# Patient Record
Sex: Male | Born: 1965 | Race: Black or African American | Hispanic: No | Marital: Married | Smoking: Former smoker
Health system: Southern US, Community
[De-identification: ages and names within clinical notes are randomized; demographics above are authoritative.]

## PROBLEM LIST (undated history)

## (undated) ENCOUNTER — Ambulatory Visit: Admission: EM

## (undated) DIAGNOSIS — M1990 Unspecified osteoarthritis, unspecified site: Secondary | ICD-10-CM

## (undated) HISTORY — PX: OTHER SURGICAL HISTORY: SHX169

## (undated) HISTORY — DX: Unspecified osteoarthritis, unspecified site: M19.90

---

## 2001-03-11 ENCOUNTER — Encounter: Payer: Self-pay | Admitting: Emergency Medicine

## 2001-03-11 ENCOUNTER — Emergency Department (HOSPITAL_COMMUNITY): Admission: EM | Admit: 2001-03-11 | Discharge: 2001-03-11 | Payer: Self-pay | Admitting: Emergency Medicine

## 2013-03-24 ENCOUNTER — Encounter (HOSPITAL_COMMUNITY): Payer: Self-pay | Admitting: *Deleted

## 2013-03-24 ENCOUNTER — Emergency Department (HOSPITAL_COMMUNITY)
Admission: EM | Admit: 2013-03-24 | Discharge: 2013-03-24 | Disposition: A | Discharge status: Home / Self Care | Payer: Self-pay | Attending: Emergency Medicine | Admitting: Emergency Medicine

## 2013-03-24 ENCOUNTER — Emergency Department (HOSPITAL_COMMUNITY): Payer: Self-pay

## 2013-03-24 DIAGNOSIS — R609 Edema, unspecified: Secondary | ICD-10-CM | POA: Insufficient documentation

## 2013-03-24 DIAGNOSIS — M7040 Prepatellar bursitis, unspecified knee: Secondary | ICD-10-CM | POA: Insufficient documentation

## 2013-03-24 DIAGNOSIS — F17200 Nicotine dependence, unspecified, uncomplicated: Secondary | ICD-10-CM | POA: Insufficient documentation

## 2013-03-24 DIAGNOSIS — M7041 Prepatellar bursitis, right knee: Secondary | ICD-10-CM

## 2013-03-24 MED ORDER — OXYCODONE-ACETAMINOPHEN 5-325 MG PO TABS
1.0000 | ORAL_TABLET | Freq: Once | ORAL | Status: AC
  Administered 2013-03-24: 1 via ORAL
  Filled 2013-03-24: qty 1

## 2013-03-24 MED ORDER — OXYCODONE-ACETAMINOPHEN 5-325 MG PO TABS: 1.0000 | ORAL_TABLET | Freq: Four times a day (QID) | ORAL | Status: DC | PRN

## 2013-03-24 MED ORDER — IBUPROFEN 600 MG PO TABS: 600.0000 mg | ORAL_TABLET | Freq: Four times a day (QID) | ORAL | Status: DC | PRN

## 2013-03-24 NOTE — ED Notes (Signed)
Pt given d/c instructions, 2 scripts given, verbalized understanding of all, walked out unaided.

## 2013-03-24 NOTE — ED Provider Notes (Signed)
CSN: [id number]     Arrival date & time 03/24/13  1201 History    This chart was scribed for [employer]. [name], MD,  by Ashley Jacobs, ED Scribe. The patient was seen in room APA06/APA06 and the patient's care was started at 2:04 PM.   First MD Initiated Contact with Patient 03/24/13 1357     Chief Complaint  Patient presents with  . Knee Pain   (Consider location/radiation/quality/duration/timing/severity/associated sxs/prior Treatment) Patient is a 47 y.o. male presenting with knee pain. The history is provided by the patient and medical records. No language interpreter was used.  Knee Pain  HPI Comments: Joel Evans is a 47 y.o. male who presents to the Emergency Department complaining of right knee pain that presented yesterday with unknown onset. Pt mentions that yesterday he suddenly had swelling, "heat" and non-radiating pain to his anterior right knee. He reports that the pain is worsened with exertion and movement and relieve by nothing. Pt did not take anything for pain.  When asked to describe the pain he mentions that he experiences pain that is sharp, throbbing and constant.  Pt denies drug usage but does report smoking Pt does not have a hx of gout or any other complications.  History reviewed. No pertinent past medical history. History reviewed. No pertinent past surgical history. History reviewed. No pertinent family history. History  Substance Use Topics  . Smoking status: Current Every Day Smoker  . Smokeless tobacco: Not on file  . Alcohol Use: No    Review of Systems  Musculoskeletal: Positive for joint swelling (right knee).  All other systems reviewed and are negative.    Allergies  Review of patient's allergies indicates no known allergies.  Home Medications   Current Outpatient Rx  Name  Route  Sig  Dispense  Refill  . ibuprofen (ADVIL,MOTRIN) 600 MG tablet   Oral   Take 1 tablet (600 mg total) by mouth every 6 (six) hours as needed for  pain.   20 tablet   0   . oxyCODONE-acetaminophen (PERCOCET/ROXICET) 5-325 MG per tablet   Oral   Take 1-2 tablets by mouth every 6 (six) hours as needed for pain.   10 tablet   0    BP 117/65  Pulse 88  Temp(Src) 98.3 F (36.8 C) (Oral)  Resp 20  Ht 5\' 9"  (1.753 m)  Wt 180 lb (81.647 kg)  BMI 26.57 kg/m2  SpO2 97% Physical Exam  Nursing note and vitals reviewed. Constitutional: He is oriented to person, place, and time. He appears well-developed and well-nourished. No distress.  HENT:  Head: Normocephalic and atraumatic.  Eyes: EOM are normal. Pupils are equal, round, and reactive to light.  Neck: Normal range of motion. Neck supple. No tracheal deviation present.  Cardiovascular: Normal rate.   Pulmonary/Chest: Effort normal and breath sounds normal. No respiratory distress.  Abdominal: Soft. He exhibits no distension.  Musculoskeletal: Normal range of motion. He exhibits edema (R knee) and tenderness (R knee).  Neurological: He is alert and oriented to person, place, and time.  Skin: Skin is warm and dry.  Mild skin thickening. Right knee effusion   Psychiatric: He has a normal mood and affect. His behavior is normal.    ED Course  DIAGNOSTIC STUDIES: Oxygen Saturation is 97% on room air, normal by my interpretation.   Medications  oxyCODONE-acetaminophen (PERCOCET/ROXICET) 5-325 MG per tablet 1 tablet (1 tablet Oral Given 03/24/13 1417)    COORDINATION OF CARE: 2:07 PM Discussed course  of care with pt . Pt understands and agrees.   Procedures (including critical care time)  Labs Reviewed - No data to display Dg Knee Complete 4 Views Right  03/24/2013   *RADIOLOGY REPORT*  Clinical Data: Knee pain and swelling  RIGHT KNEE - COMPLETE 4+ VIEW  Comparison: None.  Findings: Frontal, lateral, and bilateral oblique views were obtained.  There is no fracture, dislocation, or effusion.  There is a small spur arising from the anterior superior patella.  There is slight  patellofemoral joint space narrowing.  No erosive change.  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  No fracture or effusion.   Original Report Authenticated By: Bretta Bang, M.D.   1. Prepatellar bursitis, right     MDM  Patient with right knee pain. He is a Visual merchandiser. It is likely a prepatellar bursitis. I doubt septic bursitis. We'll give pain medicines and anti-inflammatories and follow with orthopedic I personally performed the services described in this documentation, which was scribed in my presence. The recorded information has been reviewed and is accurate.     Juliet Rude. [name], MD 03/24/13 2210

## 2013-03-24 NOTE — ED Notes (Signed)
Rt knee swollen, hot, onset yesterday

## 2013-03-24 NOTE — ED Notes (Signed)
Pt presents with rt knee pain and swelling x 24 hrs, worsening throughout the day. Pt denies trauma and injury to said knee. Denies history of gout. Pt reports is difficult to bear weight secondary to pain.  NAD noted. Pulses strong distal to swelling.

## 2013-12-19 ENCOUNTER — Encounter (HOSPITAL_COMMUNITY): Payer: Self-pay | Admitting: Emergency Medicine

## 2013-12-19 ENCOUNTER — Emergency Department (HOSPITAL_COMMUNITY)
Admission: EM | Admit: 2013-12-19 | Discharge: 2013-12-20 | Disposition: A | Discharge status: Home / Self Care | Payer: Self-pay | Attending: Emergency Medicine | Admitting: Emergency Medicine

## 2013-12-19 ENCOUNTER — Emergency Department (HOSPITAL_COMMUNITY): Payer: Self-pay

## 2013-12-19 DIAGNOSIS — Z791 Long term (current) use of non-steroidal anti-inflammatories (NSAID): Secondary | ICD-10-CM | POA: Insufficient documentation

## 2013-12-19 DIAGNOSIS — Z79899 Other long term (current) drug therapy: Secondary | ICD-10-CM | POA: Insufficient documentation

## 2013-12-19 DIAGNOSIS — L02619 Cutaneous abscess of unspecified foot: Secondary | ICD-10-CM | POA: Insufficient documentation

## 2013-12-19 DIAGNOSIS — D72829 Elevated white blood cell count, unspecified: Secondary | ICD-10-CM | POA: Insufficient documentation

## 2013-12-19 DIAGNOSIS — Z87891 Personal history of nicotine dependence: Secondary | ICD-10-CM | POA: Insufficient documentation

## 2013-12-19 DIAGNOSIS — Z792 Long term (current) use of antibiotics: Secondary | ICD-10-CM | POA: Insufficient documentation

## 2013-12-19 DIAGNOSIS — L03119 Cellulitis of unspecified part of limb: Secondary | ICD-10-CM | POA: Insufficient documentation

## 2013-12-19 MED ORDER — KETOROLAC TROMETHAMINE 30 MG/ML IJ SOLN
30.0000 mg | Freq: Once | INTRAMUSCULAR | Status: AC
  Administered 2013-12-20: 30 mg via INTRAVENOUS
  Filled 2013-12-19: qty 1

## 2013-12-19 NOTE — ED Provider Notes (Signed)
48 year old male has noted redness and swelling of his right ankle the last week. He states at one point the swelling had gone relative to his knee but actually has subsided. He is complaining of pain in that area. He denies fever or chills. He noticed a blister coming up today. On exam, there is erythema and mild/moderate soft tissue swelling throughout the right ankle with a bulla present anteriorly. There no lymphangitic streaks are seen. Laboratory work is pending. He will clearly need antibiotics. At this point, unclear whether he can be adequately treated with oral antibiotics as an outpatient or whether he will need inpatient care.  Medical screening examination/treatment/procedure(s) were conducted as a shared visit with non-physician practitioner(s) and myself.  I personally evaluated the patient during the encounter.   Delora Fuel, MD 84/16/60 6301

## 2013-12-19 NOTE — ED Notes (Signed)
Pt c/o right foot swelling. Pts right foot and ankle are red, warm, and swollen. Pt has a blister where his foot and leg meet.

## 2013-12-20 LAB — CBC WITH DIFFERENTIAL/PLATELET
BASOS ABS: 0 10*3/uL (ref 0.0–0.1)
BASOS PCT: 0 % (ref 0–1)
EOS ABS: 0.2 10*3/uL (ref 0.0–0.7)
Eosinophils Relative: 2 % (ref 0–5)
HCT: 36.4 % — ABNORMAL LOW (ref 39.0–52.0)
HEMOGLOBIN: 12.5 g/dL — AB (ref 13.0–17.0)
Lymphocytes Relative: 20 % (ref 12–46)
Lymphs Abs: 2.3 10*3/uL (ref 0.7–4.0)
MCH: 31.2 pg (ref 26.0–34.0)
MCHC: 34.3 g/dL (ref 30.0–36.0)
MCV: 90.8 fL (ref 78.0–100.0)
MONOS PCT: 10 % (ref 3–12)
Monocytes Absolute: 1.1 10*3/uL — ABNORMAL HIGH (ref 0.1–1.0)
NEUTROS ABS: 7.9 10*3/uL — AB (ref 1.7–7.7)
Neutrophils Relative %: 68 % (ref 43–77)
PLATELETS: 302 10*3/uL (ref 150–400)
RBC: 4.01 MIL/uL — ABNORMAL LOW (ref 4.22–5.81)
RDW: 12.6 % (ref 11.5–15.5)
WBC: 11.6 10*3/uL — ABNORMAL HIGH (ref 4.0–10.5)

## 2013-12-20 LAB — BASIC METABOLIC PANEL
BUN: 11 mg/dL (ref 6–23)
CO2: 27 mEq/L (ref 19–32)
Calcium: 8.8 mg/dL (ref 8.4–10.5)
Chloride: 103 mEq/L (ref 96–112)
Creatinine, Ser: 0.77 mg/dL (ref 0.50–1.35)
GFR calc Af Amer: >90 mL/min (ref >90)
Glucose, Bld: 80 mg/dL (ref 70–99)
POTASSIUM: 4 meq/L (ref 3.7–5.3)
Sodium: 140 mEq/L (ref 137–147)

## 2013-12-20 MED ORDER — CEPHALEXIN 500 MG PO CAPS: 500.0000 mg | ORAL_CAPSULE | Freq: Four times a day (QID) | ORAL | Status: DC

## 2013-12-20 MED ORDER — VANCOMYCIN HCL IN DEXTROSE 1-5 GM/200ML-% IV SOLN
1000.0000 mg | Freq: Once | INTRAVENOUS | Status: AC
  Administered 2013-12-20: 1000 mg via INTRAVENOUS
  Filled 2013-12-20: qty 200

## 2013-12-20 MED ORDER — MORPHINE SULFATE 4 MG/ML IJ SOLN
4.0000 mg | Freq: Once | INTRAMUSCULAR | Status: AC
  Administered 2013-12-20: 4 mg via INTRAVENOUS
  Filled 2013-12-20: qty 1

## 2013-12-20 MED ORDER — OXYCODONE-ACETAMINOPHEN 5-325 MG PO TABS: 1.0000 | ORAL_TABLET | ORAL | Status: DC | PRN

## 2013-12-20 MED ORDER — ONDANSETRON HCL 4 MG/2ML IJ SOLN
4.0000 mg | Freq: Once | INTRAMUSCULAR | Status: AC
  Administered 2013-12-20: 4 mg via INTRAVENOUS
  Filled 2013-12-20: qty 2

## 2013-12-20 NOTE — ED Provider Notes (Signed)
CSN: [id number]     Arrival date & time 12/19/13  2207 History   First MD Initiated Contact with Patient 12/19/13 2335     Chief Complaint  Patient presents with  . Foot Swelling     (Consider location/radiation/quality/duration/timing/severity/associated sxs/prior Treatment) The history is provided by the patient and the spouse.    Joel Evans is a 48 y.o. male presenting with a one week history of right ankle pain, swelling and redness.  He states that he had symptoms originally in his right knee as well which resolved after taking ibuprofen, but his foot pain has persisted and now worsened.  He denies fevers or chills and denies radiation of pain which is worsened with palpation and weight bearing.  He denies injury, including any knowledge of insect bites, etc.  He has no other medical conditions and does not have a primary doctor.     History reviewed. No pertinent past medical history. History reviewed. No pertinent past surgical history. No family history on file. History  Substance Use Topics  . Smoking status: Former Research scientist (life sciences)  . Smokeless tobacco: Not on file  . Alcohol Use: No    Review of Systems  Constitutional: Negative for fever and chills.  Respiratory: Negative for shortness of breath and wheezing.   Skin: Positive for color change. Negative for wound.  Neurological: Negative for numbness.      Allergies  Review of patient's allergies indicates no known allergies.  Home Medications   Prior to Admission medications   Medication Sig Start Date End Date Taking? Authorizing Provider  cephALEXin (KEFLEX) 500 MG capsule Take 1 capsule (500 mg total) by mouth 4 (four) times daily. 12/20/13   Evalee Jefferson, PA-C  ibuprofen (ADVIL,MOTRIN) 600 MG tablet Take 1 tablet (600 mg total) by mouth every 6 (six) hours as needed for pain. 03/24/13   Jasper Riling. Pickering, MD  oxyCODONE-acetaminophen (PERCOCET/ROXICET) 5-325 MG per tablet Take 1-2 tablets by mouth every 6 (six)  hours as needed for pain. 03/24/13   Jasper Riling. Pickering, MD  oxyCODONE-acetaminophen (PERCOCET/ROXICET) 5-325 MG per tablet Take 1 tablet by mouth every 4 (four) hours as needed for severe pain. 12/20/13   Evalee Jefferson, PA-C   BP 134/88  Pulse 82  Temp(Src) 97.9 F (36.6 C) (Oral)  Resp 18  Ht 5\' 9"  (1.753 m)  Wt 175 lb (79.379 kg)  BMI 25.83 kg/m2  SpO2 97% Physical Exam  Nursing note and vitals reviewed. Constitutional: He appears well-developed and well-nourished.  HENT:  Head: Normocephalic and atraumatic.  Eyes: Conjunctivae are normal.  Neck: Normal range of motion.  Cardiovascular: Normal rate, regular rhythm, normal heart sounds and intact distal pulses.   Pulses:      Dorsalis pedis pulses are 2+ on the right side, and 2+ on the left side.  Pulmonary/Chest: Effort normal and breath sounds normal. No respiratory distress. He has no wheezes.  Abdominal: Soft. Bowel sounds are normal. There is no tenderness.  Musculoskeletal: Normal range of motion.  Neurological: He is alert.  Skin: Skin is warm and dry.  Moderate edema with erythema noted right anterior ankle including medially.  There is an intact bulla anterior ankle.  No red streaking and no edema in lower leg beyond border or erythema.  Right knee is not edematous, no erythema.    Psychiatric: He has a normal mood and affect.    ED Course  Procedures (including critical care time) Labs Review Labs Reviewed  CBC WITH DIFFERENTIAL - Abnormal; Notable  for the following:    WBC 11.6 (*)    RBC 4.01 (*)    Hemoglobin 12.5 (*)    HCT 36.4 (*)    Neutro Abs 7.9 (*)    Monocytes Absolute 1.1 (*)    All other components within normal limits  BASIC METABOLIC PANEL    Imaging Review Dg Ankle Complete Right  12/20/2013   CLINICAL DATA:  Right ankle pain and swelling.  No known injury.  EXAM: RIGHT ANKLE - COMPLETE 3+ VIEW  COMPARISON:  None.  FINDINGS: Diffuse soft tissue swelling. This is more focal anteriorly without or  bulging of the skin. Superior tarsal bone hyperostosis. No bone destruction or periosteal reaction. No soft tissue gas.  IMPRESSION: Diffuse soft tissue swelling without evidence of underlying osteomyelitis.   Electronically Signed   By: Enrique Sack M.D.   On: 12/20/2013 00:27     EKG Interpretation None      MDM   Final diagnoses:  Cellulitis of foot    Pt was seen by Dr Roxanne Mins during this visit.  Patients labs and/or radiological studies were viewed and considered during the medical decision making and disposition process. Mild leukocytosis, no sign of effusion/ no osteomyelitis per plain xrays.  No fluctuance of wound to suggest abscess.  He is able to flex his ankle with minimal discomfort, doubt septic joint.  Pt was given vancomycin 1 gram IV and placed on keflex,  Oxycodone for pain relief.  Encouraged warm compresses and elevation, recheck here in 2 days if stable, tomorrow for any increased pain, swelling or spreading redness.  cellulitis edges marked with pen.    Evalee Jefferson, PA-C 12/20/13 0201

## 2013-12-20 NOTE — ED Notes (Signed)
Discharge instructions and prescriptions given and reviewed with patient.  Patient verbalized understanding to complete all antibiotic, sedating effects of Percocet and to return to ER in 2 days for wound check.  Patient ambulatory; discharged home in good condition.  Significant other accompanied discharge to drive home.

## 2013-12-20 NOTE — Discharge Instructions (Signed)
Cellulitis Cellulitis is an infection of the skin and the tissue under the skin. The infected area is usually red and tender. This happens most often in the arms and lower legs. HOME CARE   Take your antibiotic medicine as told. Finish the medicine even if you start to feel better.  Keep the infected arm or leg raised (elevated).  Put a warm cloth on the area up to 4 times per day.  Only take medicines as told by your doctor.  Keep all doctor visits as told. GET HELP RIGHT AWAY IF:   You have a fever.  You feel very sleepy.  You throw up (vomit) or have watery poop (diarrhea).  You feel sick and have muscle aches and pains.  You see red streaks on the skin coming from the infected area.  Your red area gets bigger or turns a dark color.  Your bone or joint under the infected area is painful after the skin heals.  Your infection comes back in the same area or different area.  You have a puffy (swollen) bump in the infected area.  You have new symptoms. MAKE SURE YOU:   Understand these instructions.  Will watch your condition.  Will get help right away if you are not doing well or get worse. Document Released: 01/09/2008 Document Revised: 01/22/2012 Document Reviewed: 10/08/2011 [facility] Patient Information 2014 [place], [place].  Start taking your antibiotic prescribed in the morning.  You may take the oxycodone prescribed for pain relief.  This will make you drowsy - do not drive within 4 hours of taking this medication.

## 2013-12-21 ENCOUNTER — Emergency Department (HOSPITAL_COMMUNITY)
Admission: EM | Admit: 2013-12-21 | Discharge: 2013-12-21 | Disposition: A | Discharge status: Home / Self Care | Payer: Self-pay | Attending: Emergency Medicine | Admitting: Emergency Medicine

## 2013-12-21 ENCOUNTER — Encounter (HOSPITAL_COMMUNITY): Payer: Self-pay | Admitting: Emergency Medicine

## 2013-12-21 DIAGNOSIS — Z79899 Other long term (current) drug therapy: Secondary | ICD-10-CM | POA: Insufficient documentation

## 2013-12-21 DIAGNOSIS — L03115 Cellulitis of right lower limb: Secondary | ICD-10-CM

## 2013-12-21 DIAGNOSIS — L02619 Cutaneous abscess of unspecified foot: Secondary | ICD-10-CM | POA: Insufficient documentation

## 2013-12-21 DIAGNOSIS — Z87891 Personal history of nicotine dependence: Secondary | ICD-10-CM | POA: Insufficient documentation

## 2013-12-21 DIAGNOSIS — L03119 Cellulitis of unspecified part of limb: Principal | ICD-10-CM

## 2013-12-21 DIAGNOSIS — Z792 Long term (current) use of antibiotics: Secondary | ICD-10-CM | POA: Insufficient documentation

## 2013-12-21 LAB — CBC WITH DIFFERENTIAL/PLATELET
BASOS ABS: 0 10*3/uL (ref 0.0–0.1)
BASOS PCT: 0 % (ref 0–1)
Eosinophils Absolute: 0.2 10*3/uL (ref 0.0–0.7)
Eosinophils Relative: 2 % (ref 0–5)
HCT: 37.1 % — ABNORMAL LOW (ref 39.0–52.0)
Hemoglobin: 12.7 g/dL — ABNORMAL LOW (ref 13.0–17.0)
LYMPHS PCT: 27 % (ref 12–46)
Lymphs Abs: 2.6 10*3/uL (ref 0.7–4.0)
MCH: 30.9 pg (ref 26.0–34.0)
MCHC: 34.2 g/dL (ref 30.0–36.0)
MCV: 90.3 fL (ref 78.0–100.0)
Monocytes Absolute: 0.7 10*3/uL (ref 0.1–1.0)
Monocytes Relative: 7 % (ref 3–12)
NEUTROS PCT: 64 % (ref 43–77)
Neutro Abs: 6.3 10*3/uL (ref 1.7–7.7)
Platelets: 328 10*3/uL (ref 150–400)
RBC: 4.11 MIL/uL — ABNORMAL LOW (ref 4.22–5.81)
RDW: 12.5 % (ref 11.5–15.5)
WBC: 9.8 10*3/uL (ref 4.0–10.5)

## 2013-12-21 LAB — BASIC METABOLIC PANEL
BUN: 13 mg/dL (ref 6–23)
CHLORIDE: 102 meq/L (ref 96–112)
CO2: 27 mEq/L (ref 19–32)
Calcium: 8.8 mg/dL (ref 8.4–10.5)
Creatinine, Ser: 0.83 mg/dL (ref 0.50–1.35)
GFR calc non Af Amer: >90 mL/min (ref >90)
Glucose, Bld: 115 mg/dL — ABNORMAL HIGH (ref 70–99)
Potassium: 4.2 mEq/L (ref 3.7–5.3)
Sodium: 140 mEq/L (ref 137–147)

## 2013-12-21 MED ORDER — ONDANSETRON HCL 4 MG/2ML IJ SOLN
4.0000 mg | Freq: Once | INTRAMUSCULAR | Status: AC
  Administered 2013-12-21: 4 mg via INTRAVENOUS
  Filled 2013-12-21: qty 2

## 2013-12-21 MED ORDER — CLINDAMYCIN HCL 300 MG PO CAPS: ORAL_CAPSULE | ORAL | Status: DC

## 2013-12-21 MED ORDER — HYDROMORPHONE HCL 2 MG PO TABS: 2.0000 mg | ORAL_TABLET | ORAL | Status: DC | PRN

## 2013-12-21 MED ORDER — HYDROMORPHONE HCL PF 1 MG/ML IJ SOLN
1.0000 mg | Freq: Once | INTRAMUSCULAR | Status: AC
  Administered 2013-12-21: 1 mg via INTRAVENOUS
  Filled 2013-12-21: qty 1

## 2013-12-21 MED ORDER — SODIUM CHLORIDE 0.9 % IV SOLN
Freq: Once | INTRAVENOUS | Status: AC
  Administered 2013-12-21: 19:00:00 via INTRAVENOUS

## 2013-12-21 MED ORDER — DEXTROSE 5 % IV SOLN
1.0000 g | INTRAVENOUS | Status: DC
  Administered 2013-12-21: 1 g via INTRAVENOUS
  Filled 2013-12-21: qty 10

## 2013-12-21 MED ORDER — CLINDAMYCIN PHOSPHATE 600 MG/50ML IV SOLN
600.0000 mg | Freq: Once | INTRAVENOUS | Status: AC
  Administered 2013-12-21: 600 mg via INTRAVENOUS
  Filled 2013-12-21: qty 50

## 2013-12-21 NOTE — ED Provider Notes (Signed)
CSN: [id number]     Arrival date & time 12/21/13  1817 History  This chart was scribed for Marcene Brawn PA-C,  working with Leota Jacobsen, MD by Stacy Gardner, ED scribe. This patient was seen in room APFT23/APFT23 and the patient's care was started at [facility] PM.   First MD Initiated Contact with Patient 12/21/13 1830     Chief Complaint  Patient presents with  . Cellulitis     (Consider location/radiation/quality/duration/timing/severity/associated sxs/prior Treatment) The history is provided by the patient and medical records. No language interpreter was used.   HPI Comments: JAYAN RAYMUNDO is a 48 y.o. male who presents to the Emergency Department complaining of cellulitis recheck. Pt was seen 5/16 for cellulitis. He was given percocet and antibiotics. He reports the site is getting worse and he has a large blister . The site started when had friction from his shoe. He is self employed.     History reviewed. No pertinent past medical history. History reviewed. No pertinent past surgical history. History reviewed. No pertinent family history. History  Substance Use Topics  . Smoking status: Former Research scientist (life sciences)  . Smokeless tobacco: Not on file  . Alcohol Use: No    Review of Systems  Skin: Positive for wound.       Cellulitis to the right foot.       Allergies  Review of patient's allergies indicates no known allergies.  Home Medications   Prior to Admission medications   Medication Sig Start Date End Date Taking? Authorizing Provider  cephALEXin (KEFLEX) 500 MG capsule Take 1 capsule (500 mg total) by mouth 4 (four) times daily. 12/20/13   Evalee Jefferson, PA-C  ibuprofen (ADVIL,MOTRIN) 600 MG tablet Take 1 tablet (600 mg total) by mouth every 6 (six) hours as needed for pain. 03/24/13   Jasper Riling. Pickering, MD  oxyCODONE-acetaminophen (PERCOCET/ROXICET) 5-325 MG per tablet Take 1-2 tablets by mouth every 6 (six) hours as needed for pain. 03/24/13   Jasper Riling. Pickering, MD   oxyCODONE-acetaminophen (PERCOCET/ROXICET) 5-325 MG per tablet Take 1 tablet by mouth every 4 (four) hours as needed for severe pain. 12/20/13   Evalee Jefferson, PA-C   BP 120/69  Pulse 76  Temp(Src) 98.3 F (36.8 C) (Oral)  Resp 20  Ht 5\' 9"  (1.753 m)  Wt 175 lb (79.379 kg)  BMI 25.83 kg/m2  SpO2 100% Physical Exam  Nursing note and vitals reviewed. Constitutional: He is oriented to person, place, and time. He appears well-developed and well-nourished.  HENT:  Head: Normocephalic.  Eyes: EOM are normal.  Neck: Normal range of motion.  Pulmonary/Chest: Effort normal.  Abdominal: He exhibits no distension.  Musculoskeletal: Normal range of motion.  Neurological: He is alert and oriented to person, place, and time.  Skin: There is erythema.  Erythematous area. Does not extend beyond previously marked area. 2 cm x4 cm blister with serous fluid. Doesn't not appears to be pus.  Psychiatric: He has a normal mood and affect.    ED Course  Procedures (including critical care time) DIAGNOSTIC STUDIES: Oxygen Saturation is 100% on room air, normal by my interpretation.    COORDINATION OF CARE:  6:47 PM Discussed course of care with pt which includes IV antibiotics. Pt understands and agrees.   Labs Review Labs Reviewed - No data to display  Imaging Review Dg Ankle Complete Right  12/20/2013   CLINICAL DATA:  Right ankle pain and swelling.  No known injury.  EXAM: RIGHT ANKLE - COMPLETE 3+ VIEW  COMPARISON:  None.  FINDINGS: Diffuse soft tissue swelling. This is more focal anteriorly without or bulging of the skin. Superior tarsal bone hyperostosis. No bone destruction or periosteal reaction. No soft tissue gas.  IMPRESSION: Diffuse soft tissue swelling without evidence of underlying osteomyelitis.   Electronically Signed   By: Enrique Sack M.D.   On: 12/20/2013 00:27     EKG Interpretation None    Pt given IV clindamycin,  Dilaudid,   Labs reviewed Decreased wbc's.   I will treat with  clindamycin.   Pt given rx for 10 tablets dilaudid for pain.   Pt advised to recheck here tomorrow afternoon.    MDM   Final diagnoses:  Cellulitis of foot, right    rx for clindamycin Dilaudid  Fransico Meadow, PA-C 12/21/13 2105

## 2013-12-21 NOTE — Discharge Instructions (Signed)
Cellulitis Cellulitis is an infection of the skin and the tissue beneath it. The infected area is usually red and tender. Cellulitis occurs most often in the arms and lower legs.  CAUSES  Cellulitis is caused by bacteria that enter the skin through cracks or cuts in the skin. The most common types of bacteria that cause cellulitis are Staphylococcus and Streptococcus. SYMPTOMS   Redness and warmth.  Swelling.  Tenderness or pain.  Fever. DIAGNOSIS  Your caregiver can usually determine what is wrong based on a physical exam. Blood tests may also be done. TREATMENT  Treatment usually involves taking an antibiotic medicine. HOME CARE INSTRUCTIONS   Take your antibiotics as directed. Finish them even if you start to feel better.  Keep the infected arm or leg elevated to reduce swelling.  Apply a warm cloth to the affected area up to 4 times per day to relieve pain.  Only take over-the-counter or prescription medicines for pain, discomfort, or fever as directed by your caregiver.  Keep all follow-up appointments as directed by your caregiver. SEEK MEDICAL CARE IF:   You notice red streaks coming from the infected area.  Your red area gets larger or turns dark in color.  Your bone or joint underneath the infected area becomes painful after the skin has healed.  Your infection returns in the same area or another area.  You notice a swollen bump in the infected area.  You develop new symptoms. SEEK IMMEDIATE MEDICAL CARE IF:   You have a fever.  You feel very sleepy.  You develop vomiting or diarrhea.  You have a general ill feeling (malaise) with muscle aches and pains. MAKE SURE YOU:   Understand these instructions.  Will watch your condition.  Will get help right away if you are not doing well or get worse. Document Released: 05/02/2005 Document Revised: 01/22/2012 Document Reviewed: 10/08/2011 [employer] Patient Information 2014 [employer].  

## 2013-12-21 NOTE — ED Notes (Signed)
Cellulitis  Of rt foot, seen here 5/16, Here for recheck, cont to have pain

## 2013-12-21 NOTE — ED Provider Notes (Signed)
Medical screening examination/treatment/procedure(s) were performed by non-physician practitioner and as supervising physician I was immediately available for consultation/collaboration.  Shirleyann Montero T Kenwood Rosiak, MD 12/21/13 2314 

## 2016-02-16 ENCOUNTER — Emergency Department (HOSPITAL_COMMUNITY)
Admission: EM | Admit: 2016-02-16 | Discharge: 2016-02-16 | Disposition: A | Discharge status: Home / Self Care | Payer: Self-pay | Attending: Emergency Medicine | Admitting: Emergency Medicine

## 2016-02-16 ENCOUNTER — Encounter (HOSPITAL_COMMUNITY): Payer: Self-pay | Admitting: Cardiology

## 2016-02-16 DIAGNOSIS — Z87891 Personal history of nicotine dependence: Secondary | ICD-10-CM | POA: Insufficient documentation

## 2016-02-16 DIAGNOSIS — L03111 Cellulitis of right axilla: Secondary | ICD-10-CM | POA: Insufficient documentation

## 2016-02-16 MED ORDER — HYDROCODONE-ACETAMINOPHEN 5-325 MG PO TABS: ORAL_TABLET | ORAL | Status: DC

## 2016-02-16 MED ORDER — DOXYCYCLINE HYCLATE 100 MG PO TABS
100.0000 mg | ORAL_TABLET | Freq: Once | ORAL | Status: AC
  Administered 2016-02-16: 100 mg via ORAL
  Filled 2016-02-16: qty 1

## 2016-02-16 MED ORDER — DOXYCYCLINE HYCLATE 100 MG PO CAPS: 100.0000 mg | ORAL_CAPSULE | Freq: Two times a day (BID) | ORAL | Status: DC

## 2016-02-16 MED ORDER — IBUPROFEN 800 MG PO TABS: 800.0000 mg | ORAL_TABLET | Freq: Three times a day (TID) | ORAL | Status: DC

## 2016-02-16 NOTE — ED Notes (Signed)
Pt seen and evaluated by EDPa for initial assessment. 

## 2016-02-16 NOTE — Discharge Instructions (Signed)
Cellulitis °Cellulitis is an infection of the skin and the tissue under the skin. The infected area is usually red and tender. This happens most often in the arms and lower legs. °HOME CARE  °· Take your antibiotic medicine as told. Finish the medicine even if you start to feel better. °· Keep the infected arm or leg raised (elevated). °· Put a warm cloth on the area up to 4 times per day. °· Only take medicines as told by your doctor. °· Keep all doctor visits as told. °GET HELP IF: °· You see red streaks on the skin coming from the infected area. °· Your red area gets bigger or turns a dark color. °· Your bone or joint under the infected area is painful after the skin heals. °· Your infection comes back in the same area or different area. °· You have a puffy (swollen) bump in the infected area. °· You have new symptoms. °· You have a fever. °GET HELP RIGHT AWAY IF:  °· You feel very sleepy. °· You throw up (vomit) or have watery poop (diarrhea). °· You feel sick and have muscle aches and pains. °  °This information is not intended to replace advice given to you by your health care provider. Make sure you discuss any questions you have with your health care provider. °  °Document Released: 01/09/2008 Document Revised: 04/13/2015 Document Reviewed: 10/08/2011 °[employer]vier Interactive Patient Education ©2016 [employer] ° °

## 2016-02-16 NOTE — ED Notes (Signed)
Rash under right arm pit and pain times 4 days.

## 2016-02-16 NOTE — ED Provider Notes (Signed)
CSN: [id number]     Arrival date & time 02/16/16  1734 History   First MD Initiated Contact with Patient 02/16/16 1740     Chief Complaint  Patient presents with  . Rash     (Consider location/radiation/quality/duration/timing/severity/associated sxs/prior Treatment) HPI   Joel Evans is a 50 y.o. male who presents to the Emergency Department complaining of redness, rash and pain to the right underarm area for 4 days.  He reports removing a tick from this area prior to onset of symptoms.  He complains of "soreness" to his under arm that is worse with arm movement.  He denies fever, chills, joint pains or other rash.  He has taken tylenol without relief.     History reviewed. No pertinent past medical history. History reviewed. No pertinent past surgical history. History reviewed. No pertinent family history. Social History  Substance Use Topics  . Smoking status: Former Research scientist (life sciences)  . Smokeless tobacco: None  . Alcohol Use: No    Review of Systems  Constitutional: Negative for fever, chills, activity change and appetite change.  HENT: Negative for facial swelling, sore throat and trouble swallowing.   Respiratory: Negative for chest tightness, shortness of breath and wheezing.   Musculoskeletal: Negative for neck pain and neck stiffness.  Skin: Positive for color change (right axilla) and rash. Negative for wound.  Neurological: Negative for dizziness, weakness, numbness and headaches.  All other systems reviewed and are negative.     Allergies  Review of patient's allergies indicates no known allergies.  Home Medications   Prior to Admission medications   Medication Sig Start Date End Date Taking? Authorizing Provider  cephALEXin (KEFLEX) 500 MG capsule Take 1 capsule (500 mg total) by mouth 4 (four) times daily. 12/20/13   Evalee Jefferson, PA-C  clindamycin (CLEOCIN) 300 MG capsule One po qid 12/21/13   Fransico Meadow, PA-C  HYDROmorphone (DILAUDID) 2 MG tablet Take 1 tablet  (2 mg total) by mouth every 4 (four) hours as needed for severe pain. 12/21/13   Fransico Meadow, PA-C  oxyCODONE-acetaminophen (PERCOCET/ROXICET) 5-325 MG per tablet Take 1 tablet by mouth every 4 (four) hours as needed for severe pain. 12/20/13   Evalee Jefferson, PA-C   BP 131/80 mmHg  Pulse 82  Temp(Src) 98.2 F (36.8 C)  Resp 18  Ht 5\' 9"  (1.753 m)  Wt 78.019 kg  BMI 25.39 kg/m2  SpO2 100% Physical Exam  Constitutional: He is oriented to person, place, and time. He appears well-developed and well-nourished. No distress.  HENT:  Head: Normocephalic.  Cardiovascular: Normal rate and regular rhythm.   Pulmonary/Chest: Effort normal and breath sounds normal. No respiratory distress.  Musculoskeletal: Normal range of motion.  Neurological: He is oriented to person, place, and time. Coordination normal.  Skin: Skin is warm. There is erythema.  12 cm localized erythema of the right axilla.  Small central area of induration and pin point pustules.  No fluctuance  Psychiatric: He has a normal mood and affect.  Nursing note and vitals reviewed.   ED Course  Procedures (including critical care time) Labs Review Labs Reviewed - No data to display  Imaging Review No results found. I have personally reviewed and evaluated these images and lab results as part of my medical decision-making.   EKG Interpretation None      MDM   Final diagnoses:  Cellulitis of axilla, right    Pt well appearing, leading edge of erythema marked by me.  No drainable abscess at  this time.  Return precautions given.  Pt agrees to warm wet compresses and Rx for doxy, vicodin #12, and ibuprofen    Kem Parkinson, PA-C 02/16/16 1912  Fredia Sorrow, MD 02/17/16 [phone]

## 2017-03-20 ENCOUNTER — Emergency Department (HOSPITAL_COMMUNITY)
Admission: EM | Admit: 2017-03-20 | Discharge: 2017-03-21 | Disposition: A | Discharge status: Home / Self Care | Payer: Self-pay | Attending: Emergency Medicine | Admitting: Emergency Medicine

## 2017-03-20 ENCOUNTER — Encounter (HOSPITAL_COMMUNITY): Payer: Self-pay | Admitting: *Deleted

## 2017-03-20 DIAGNOSIS — J029 Acute pharyngitis, unspecified: Secondary | ICD-10-CM | POA: Insufficient documentation

## 2017-03-20 DIAGNOSIS — F17210 Nicotine dependence, cigarettes, uncomplicated: Secondary | ICD-10-CM | POA: Insufficient documentation

## 2017-03-20 DIAGNOSIS — J069 Acute upper respiratory infection, unspecified: Secondary | ICD-10-CM | POA: Insufficient documentation

## 2017-03-20 NOTE — ED Triage Notes (Signed)
Pt c/o sore throat x 2 days with pain radiating to right ear; pt states it is very hard to swallow and has had a decrease in appetite due to the pain

## 2017-03-21 MED ORDER — IBUPROFEN 600 MG PO TABS: 600.0000 mg | ORAL_TABLET | Freq: Four times a day (QID) | ORAL | 0 refills | Status: DC

## 2017-03-21 MED ORDER — IBUPROFEN 800 MG PO TABS
800.0000 mg | ORAL_TABLET | Freq: Once | ORAL | Status: AC
  Administered 2017-03-21: 800 mg via ORAL
  Filled 2017-03-21: qty 1

## 2017-03-21 MED ORDER — AMOXICILLIN 500 MG PO CAPS: 500.0000 mg | ORAL_CAPSULE | Freq: Three times a day (TID) | ORAL | 0 refills | Status: DC

## 2017-03-21 MED ORDER — AMOXICILLIN 250 MG PO CAPS
500.0000 mg | ORAL_CAPSULE | Freq: Once | ORAL | Status: AC
  Administered 2017-03-21: 500 mg via ORAL
  Filled 2017-03-21: qty 2

## 2017-03-21 MED ORDER — ACETAMINOPHEN 325 MG PO TABS
650.0000 mg | ORAL_TABLET | Freq: Once | ORAL | Status: AC
  Administered 2017-03-21: 650 mg via ORAL
  Filled 2017-03-21: qty 2

## 2017-03-21 NOTE — Discharge Instructions (Signed)
Please use salt water gargles 3 or 4 times daily. Wash hands frequently. Use Amoxil 3 times daily with food. Use ibuprofen 600 mg, and 500 mg of Tylenol every 6 hours for pain. Please wash hands frequently. Please usual mask until symptoms have resolved.

## 2017-03-22 NOTE — ED Provider Notes (Signed)
[facility] DEPT Provider Note   CSN: [id number] Arrival date & time: 03/20/17  2311     History   Chief Complaint Chief Complaint  Patient presents with  . Sore Throat    HPI Joel Evans is a 51 y.o. male.  The history is provided by the patient.  Sore Throat  This is a new problem. The current episode started 2 days ago. The problem occurs hourly. The problem has been gradually worsening. Associated symptoms include headaches. Pertinent negatives include no chest pain, no abdominal pain and no shortness of breath. The symptoms are aggravated by swallowing. Nothing relieves the symptoms. He has tried acetaminophen for the symptoms. The treatment provided no relief.    History reviewed. No pertinent past medical history.  There are no active problems to display for this patient.   History reviewed. No pertinent surgical history.     Home Medications    Prior to Admission medications   Medication Sig Start Date End Date Taking? Authorizing Provider  amoxicillin (AMOXIL) 500 MG capsule Take 1 capsule (500 mg total) by mouth 3 (three) times daily. 03/21/17   Lily Kocher, PA-C  cephALEXin (KEFLEX) 500 MG capsule Take 1 capsule (500 mg total) by mouth 4 (four) times daily. 12/20/13   Evalee Jefferson, PA-C  ibuprofen (ADVIL,MOTRIN) 600 MG tablet Take 1 tablet (600 mg total) by mouth 4 (four) times daily. 03/21/17   Lily Kocher, PA-C    Family History History reviewed. No pertinent family history.  Social History Social History  Substance Use Topics  . Smoking status: Current Every Day Smoker    Packs/day: 0.50    Types: Cigarettes  . Smokeless tobacco: Never Used  . Alcohol use No     Allergies   Patient has no known allergies.   Review of Systems Review of Systems  Constitutional: Negative for activity change.       All ROS Neg except as noted in HPI  HENT: Positive for sore throat. Negative for nosebleeds.   Eyes: Negative for photophobia and  discharge.  Respiratory: Negative for cough, shortness of breath and wheezing.   Cardiovascular: Negative for chest pain and palpitations.  Gastrointestinal: Negative for abdominal pain and blood in stool.  Genitourinary: Negative for dysuria, frequency and hematuria.  Musculoskeletal: Negative for arthralgias, back pain and neck pain.  Skin: Negative.   Neurological: Positive for headaches. Negative for dizziness, seizures and speech difficulty.  Psychiatric/Behavioral: Negative for confusion and hallucinations.     Physical Exam Updated Vital Signs BP 128/87 (BP Location: Left Arm)   Pulse 76   Temp 98.3 F (36.8 C) (Oral)   Resp 16   Ht 5\' 9"  (1.753 m)   Wt 75.8 kg (167 lb)   SpO2 98%   BMI 24.66 kg/m   Physical Exam  Constitutional: He is oriented to person, place, and time. He appears well-developed and well-nourished.  Non-toxic appearance.  HENT:  Head: Normocephalic.  Right Ear: Tympanic membrane and external ear normal.  Left Ear: Tympanic membrane and external ear normal.  Mouth/Throat: Uvula is midline. No trismus in the jaw. Uvula swelling present. Posterior oropharyngeal erythema present. Tonsillar exudate.  Eyes: Pupils are equal, round, and reactive to light. EOM and lids are normal.  Neck: Normal range of motion. Neck supple. Carotid bruit is not present.  Cardiovascular: Normal rate, regular rhythm, normal heart sounds, intact distal pulses and normal pulses.   Pulmonary/Chest: Breath sounds normal. No respiratory distress.  Abdominal: Soft. Bowel sounds are normal. There  is no tenderness. There is no guarding.  Musculoskeletal: Normal range of motion.  Lymphadenopathy:       Head (right side): No submandibular adenopathy present.       Head (left side): No submandibular adenopathy present.    He has no cervical adenopathy.  Neurological: He is alert and oriented to person, place, and time. He has normal strength. No cranial nerve deficit or sensory deficit.   Skin: Skin is warm and dry.  Psychiatric: He has a normal mood and affect. His speech is normal.  Nursing note and vitals reviewed.    ED Treatments / Results  Labs (all labs ordered are listed, but only abnormal results are displayed) Labs Reviewed - No data to display  EKG  EKG Interpretation None       Radiology No results found.  Procedures Procedures (including critical care time)  Medications Ordered in ED Medications  amoxicillin (AMOXIL) capsule 500 mg (500 mg Oral Given 03/21/17 0032)  ibuprofen (ADVIL,MOTRIN) tablet 800 mg (800 mg Oral Given 03/21/17 0032)  acetaminophen (TYLENOL) tablet 650 mg (650 mg Oral Given 03/21/17 0033)     Initial Impression / Assessment and Plan / ED Course  I have reviewed the triage vital signs and the nursing notes.  Pertinent labs & imaging results that were available during my care of the patient were reviewed by me and considered in my medical decision making (see chart for details).       Final Clinical Impressions(s) / ED Diagnoses MDM Vital signs within normal limits. Examination suggests acute pharyngitis. Patient will be treated with antibiotics and anti-inflammatory pain medication. Patient will use salt water gargles and Chloraseptic Spray to assist with discomfort. We discussed the importance of good handwashing and good hydration. Patient is in agreement with this plan.   Final diagnoses:  Pharyngitis, unspecified etiology  Upper respiratory tract infection, unspecified type    New Prescriptions Discharge Medication List as of 03/21/2017 12:20 AM    START taking these medications   Details  amoxicillin (AMOXIL) 500 MG capsule Take 1 capsule (500 mg total) by mouth 3 (three) times daily., Starting Thu 03/21/2017, Print    ibuprofen (ADVIL,MOTRIN) 600 MG tablet Take 1 tablet (600 mg total) by mouth 4 (four) times daily., Starting Thu 03/21/2017, Print         Lily Kocher, PA-C 03/22/17 1226    Rolland Porter, MD 03/26/17 [phone]

## 2017-07-06 ENCOUNTER — Emergency Department (HOSPITAL_COMMUNITY)
Admission: EM | Admit: 2017-07-06 | Discharge: 2017-07-06 | Disposition: A | Discharge status: Home / Self Care | Payer: Self-pay | Attending: Emergency Medicine | Admitting: Emergency Medicine

## 2017-07-06 ENCOUNTER — Other Ambulatory Visit: Payer: Self-pay

## 2017-07-06 ENCOUNTER — Encounter (HOSPITAL_COMMUNITY): Payer: Self-pay | Admitting: Emergency Medicine

## 2017-07-06 ENCOUNTER — Emergency Department (HOSPITAL_COMMUNITY): Payer: Self-pay

## 2017-07-06 DIAGNOSIS — F17210 Nicotine dependence, cigarettes, uncomplicated: Secondary | ICD-10-CM | POA: Insufficient documentation

## 2017-07-06 DIAGNOSIS — M5442 Lumbago with sciatica, left side: Secondary | ICD-10-CM | POA: Insufficient documentation

## 2017-07-06 MED ORDER — METHOCARBAMOL 500 MG PO TABS: 500.0000 mg | ORAL_TABLET | Freq: Two times a day (BID) | ORAL | 0 refills | Status: DC

## 2017-07-06 MED ORDER — PREDNISONE 10 MG PO TABS: ORAL_TABLET | ORAL | 0 refills | Status: DC

## 2017-07-06 MED ORDER — HYDROCODONE-ACETAMINOPHEN 5-325 MG PO TABS: 2.0000 | ORAL_TABLET | ORAL | 0 refills | Status: DC | PRN

## 2017-07-06 NOTE — Discharge Instructions (Signed)
Schedule to see Dr. Aline Brochure for evaluation if pain persist

## 2017-07-06 NOTE — ED Notes (Signed)
Patient transported to X-ray 

## 2017-07-06 NOTE — ED Triage Notes (Signed)
Pt c/o left lower back pain radiating down left leg x 2 days since lifting something heavy.

## 2017-07-06 NOTE — ED Provider Notes (Signed)
[facility] EMERGENCY DEPARTMENT Provider Note   CSN: [id number] Arrival date & time: 07/06/17  0623     History   Chief Complaint Chief Complaint  Patient presents with  . Back Pain    HPI Joel Evans is a 51 y.o. male.  The history is provided by the patient. No language interpreter was used.  Back Pain   This is a new problem. The problem occurs constantly. The pain is associated with lifting heavy objects. The pain is present in the lumbar spine. The pain does not radiate. The pain is moderate. The pain is worse during the day. Pertinent negatives include no paresthesias and no paresis. He has tried nothing for the symptoms. The treatment provided no relief.   Pt complains of pain in low back that goes down his left leg.  Pt has pain with walking. History reviewed. No pertinent past medical history.  There are no active problems to display for this patient.   History reviewed. No pertinent surgical history.     Home Medications    Prior to Admission medications   Medication Sig Start Date End Date Taking? Authorizing Provider  ibuprofen (ADVIL,MOTRIN) 600 MG tablet Take 1 tablet (600 mg total) by mouth 4 (four) times daily. Patient not taking: Reported on 07/06/2017 03/21/17   Lily Kocher, PA-C    Family History No family history on file.  Social History Social History   Tobacco Use  . Smoking status: Current Every Day Smoker    Packs/day: 0.50    Types: Cigarettes  . Smokeless tobacco: Never Used  Substance Use Topics  . Alcohol use: No  . Drug use: No     Allergies   Patient has no known allergies.   Review of Systems Review of Systems  Musculoskeletal: Positive for back pain.  Neurological: Negative for paresthesias.  All other systems reviewed and are negative.    Physical Exam Updated Vital Signs BP 124/77   Pulse 69   Temp 97.9 F (36.6 C)   Resp 18   Ht 6\' 1"  (1.854 m)   Wt 79.4 kg (175 lb)   SpO2 99%   BMI 23.09 kg/m     Physical Exam  Constitutional: He appears well-developed and well-nourished.  HENT:  Head: Normocephalic and atraumatic.  Eyes: Conjunctivae are normal.  Neck: Neck supple.  Cardiovascular: Normal rate and regular rhythm.  No murmur heard. Pulmonary/Chest: Effort normal and breath sounds normal. No respiratory distress.  Abdominal: Soft. There is no tenderness.  Musculoskeletal: Normal range of motion. He exhibits no edema.  Tender ls sone,  Pain with range of motion, nv and ns intact  Tender sciatic notch.  Pain with movement of leg,  nv and ns intact  Neurological: He is alert.  Skin: Skin is warm and dry.  Psychiatric: He has a normal mood and affect.  Nursing note and vitals reviewed.    ED Treatments / Results  Labs (all labs ordered are listed, but only abnormal results are displayed) Labs Reviewed - No data to display  EKG  EKG Interpretation None       Radiology Dg Lumbar Spine Complete  Result Date: 07/06/2017 CLINICAL DATA:  Low back pain radiating into left lower extremity. EXAM: LUMBAR SPINE - COMPLETE 4+ VIEW COMPARISON:  None. FINDINGS: No fracture or subluxation identified. There is osteophyte formation throughout the lumbar spine. Suggestion of mild disc space narrowing at L3-4. Otherwise no significant disc space narrowing. Facet hypertrophy present at multiple levels. No bony  lesions or destruction. 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 Electronically Signed   By: Aletta Edouard M.D.   On: 07/06/2017 09:56    Procedures Procedures (including critical care time)  Medications Ordered in ED Medications - No data to display   Initial Impression / Assessment and Plan / ED Course  I have reviewed the triage vital signs and the nursing notes.  Pertinent labs & imaging results that were available during my care of the patient were reviewed by me and considered in my medical decision  making (see chart for details).       Final Clinical Impressions(s) / ED Diagnoses   Final diagnoses:  Acute low back pain with left-sided sciatica, unspecified back pain laterality    ED Discharge Orders        Ordered    predniSONE (DELTASONE) 10 MG tablet     07/06/17 1021    methocarbamol (ROBAXIN) 500 MG tablet  2 times daily     07/06/17 1021    HYDROcodone-acetaminophen (NORCO/VICODIN) 5-325 MG tablet  Every 4 hours PRN     07/06/17 1021    An After Visit Summary was printed and given to the patient.    Fransico Meadow, [place] 07/06/17 1023    Mesner, Corene Cornea, MD 07/06/17 1047

## 2018-03-03 ENCOUNTER — Emergency Department (HOSPITAL_COMMUNITY)
Admission: EM | Admit: 2018-03-03 | Discharge: 2018-03-04 | Disposition: A | Discharge status: Home / Self Care | Payer: Self-pay | Attending: Emergency Medicine | Admitting: Emergency Medicine

## 2018-03-03 ENCOUNTER — Emergency Department (HOSPITAL_COMMUNITY): Payer: Self-pay

## 2018-03-03 ENCOUNTER — Encounter (HOSPITAL_COMMUNITY): Payer: Self-pay | Admitting: *Deleted

## 2018-03-03 ENCOUNTER — Other Ambulatory Visit: Payer: Self-pay

## 2018-03-03 DIAGNOSIS — R42 Dizziness and giddiness: Secondary | ICD-10-CM | POA: Insufficient documentation

## 2018-03-03 DIAGNOSIS — Z87891 Personal history of nicotine dependence: Secondary | ICD-10-CM | POA: Insufficient documentation

## 2018-03-03 LAB — CBC
HCT: 38.8 % — ABNORMAL LOW (ref 39.0–52.0)
Hemoglobin: 13.4 g/dL (ref 13.0–17.0)
MCH: 31.7 pg (ref 26.0–34.0)
MCHC: 34.5 g/dL (ref 30.0–36.0)
MCV: 91.7 fL (ref 78.0–100.0)
Platelets: 229 10*3/uL (ref 150–400)
RBC: 4.23 MIL/uL (ref 4.22–5.81)
RDW: 12.8 % (ref 11.5–15.5)
WBC: 9.9 10*3/uL (ref 4.0–10.5)

## 2018-03-03 LAB — URINALYSIS, ROUTINE W REFLEX MICROSCOPIC
Bilirubin Urine: NEGATIVE
Glucose, UA: NEGATIVE mg/dL
HGB URINE DIPSTICK: NEGATIVE
Ketones, ur: NEGATIVE mg/dL
Leukocytes, UA: NEGATIVE
Nitrite: NEGATIVE
Protein, ur: NEGATIVE mg/dL
SPECIFIC GRAVITY, URINE: 1.02 (ref 1.005–1.030)
pH: 6 (ref 5.0–8.0)

## 2018-03-03 LAB — APTT: aPTT: 34 seconds (ref 24–36)

## 2018-03-03 LAB — DIFFERENTIAL
Basophils Absolute: 0 10*3/uL (ref 0.0–0.1)
Basophils Relative: 0 %
EOS PCT: 3 %
Eosinophils Absolute: 0.3 10*3/uL (ref 0.0–0.7)
LYMPHS ABS: 3.3 10*3/uL (ref 0.7–4.0)
Lymphocytes Relative: 33 %
MONO ABS: 0.8 10*3/uL (ref 0.1–1.0)
Monocytes Relative: 8 %
NEUTROS PCT: 56 %
Neutro Abs: 5.6 10*3/uL (ref 1.7–7.7)

## 2018-03-03 LAB — COMPREHENSIVE METABOLIC PANEL
ALBUMIN: 4 g/dL (ref 3.5–5.0)
ALT: 12 U/L (ref 0–44)
ANION GAP: 8 (ref 5–15)
AST: 20 U/L (ref 15–41)
Alkaline Phosphatase: 63 U/L (ref 38–126)
BILIRUBIN TOTAL: 0.8 mg/dL (ref 0.3–1.2)
BUN: 9 mg/dL (ref 6–20)
CALCIUM: 8.8 mg/dL — AB (ref 8.9–10.3)
CO2: 27 mmol/L (ref 22–32)
Chloride: 103 mmol/L (ref 98–111)
Creatinine, Ser: 0.9 mg/dL (ref 0.61–1.24)
GFR calc non Af Amer: >60 mL/min (ref >60)
GLUCOSE: 93 mg/dL (ref 70–99)
Potassium: 4.1 mmol/L (ref 3.5–5.1)
SODIUM: 138 mmol/L (ref 135–145)
TOTAL PROTEIN: 7.2 g/dL (ref 6.5–8.1)

## 2018-03-03 LAB — TROPONIN I: Troponin I: <0.03 ng/mL (ref <0.03)

## 2018-03-03 LAB — ETHANOL

## 2018-03-03 LAB — RAPID URINE DRUG SCREEN, HOSP PERFORMED
AMPHETAMINES: NOT DETECTED
BENZODIAZEPINES: NOT DETECTED
Barbiturates: NOT DETECTED
COCAINE: NOT DETECTED
Opiates: NOT DETECTED
Tetrahydrocannabinol: POSITIVE — AB

## 2018-03-03 LAB — PROTIME-INR
INR: 1.12
PROTHROMBIN TIME: 14.3 s (ref 11.4–15.2)

## 2018-03-03 MED ORDER — MECLIZINE HCL 25 MG PO TABS: 25.0000 mg | ORAL_TABLET | Freq: Three times a day (TID) | ORAL | 0 refills | Status: DC | PRN

## 2018-03-03 NOTE — Discharge Instructions (Signed)
Take the medication as needed for dizziness, follow-up with your doctor if the symptoms do not resolve over the next several days, return for difficulty with your vision speech or other concerning symptoms

## 2018-03-03 NOTE — ED Triage Notes (Signed)
Pt c/o dizziness for the past few days that is worse with movement,

## 2018-03-03 NOTE — ED Provider Notes (Signed)
[facility] EMERGENCY DEPARTMENT Provider Note   CSN: [id number] Arrival date & time: 03/03/18  1932     History   Chief Complaint Chief Complaint  Patient presents with  . Dizziness    HPI Joel Evans is a 52 y.o. male.  HPI Patient presents to the emergency room for evaluation of dizziness.  Patient states he has noticed symptoms for the last few days.  When it first occurred he had to steady himself when he first stood up.  Symptoms slowly resolved and he was able to go to work.  Today he had another episode when he was underneath a cabinet and he turned his head a certain way all of a sudden he felt his head spinning and he felt like he was going to pass out.  Patient did not lose consciousness.  He denies any trouble with chest pain or shortness of breath.  He is not sure if he had some issues with his speech.  His wife mentioned it sounded a little bit off the other day.  He denies any focal weakness.  He has not had any vision issues.  He denies any numbness.  Patient is able to walk without falling or feeling unsteady right now.  Patient denies any history of stroke. History reviewed. No pertinent past medical history.  There are no active problems to display for this patient.   History reviewed. No pertinent surgical history.      Home Medications    Prior to Admission medications   Medication Sig Start Date End Date Taking? Authorizing Provider  meclizine (ANTIVERT) 25 MG tablet Take 1 tablet (25 mg total) by mouth 3 (three) times daily as needed for dizziness. 03/03/18   Dorie Rank, MD    Family History No family history on file.  Social History Social History   Tobacco Use  . Smoking status: Former Smoker    Packs/day: 0.50    Types: Cigarettes  . Smokeless tobacco: Never Used  Substance Use Topics  . Alcohol use: No  . Drug use: No     Allergies   Patient has no known allergies.   Review of Systems Review of Systems  All other systems  reviewed and are negative.    Physical Exam Updated Vital Signs BP (!) 117/92   Pulse (!) 54   Temp (!) 97.4 F (36.3 C)   Resp (!) 21   Ht 1.829 m (6')   Wt 82.6 kg (182 lb)   SpO2 98%   BMI 24.68 kg/m   Physical Exam  Constitutional: He is oriented to person, place, and time. He appears well-developed and well-nourished. No distress.  HENT:  Head: Normocephalic and atraumatic.  Right Ear: External ear normal.  Left Ear: External ear normal.  Mouth/Throat: Oropharynx is clear and moist.  Eyes: Conjunctivae are normal. Right eye exhibits no discharge. Left eye exhibits no discharge. No scleral icterus.  Neck: Neck supple. No tracheal deviation present.  Cardiovascular: Normal rate, regular rhythm and intact distal pulses.  Pulmonary/Chest: Effort normal and breath sounds normal. No stridor. No respiratory distress. He has no wheezes. He has no rales.  Abdominal: Soft. Bowel sounds are normal. He exhibits no distension. There is no tenderness. There is no rebound and no guarding.  Musculoskeletal: He exhibits no edema or tenderness.  Neurological: He is alert and oriented to person, place, and time. He has normal strength. No cranial nerve deficit (No facial droop, extraocular movements intact, tongue midline ) or sensory deficit.  He exhibits normal muscle tone. He displays no seizure activity. Coordination normal.  No pronator drift bilateral upper extrem, able to hold both legs off bed for 5 seconds, sensation intact in all extremities, no visual field cuts, no left or right sided neglect, normal finger-nose exam bilaterally, no nystagmus noted   Skin: Skin is warm and dry. No rash noted.  Psychiatric: He has a normal mood and affect.  Nursing note and vitals reviewed.    ED Treatments / Results  Labs (all labs ordered are listed, but only abnormal results are displayed) Labs Reviewed  CBC - Abnormal; Notable for the following components:      Result Value   HCT 38.8 (*)     All other components within normal limits  COMPREHENSIVE METABOLIC PANEL - Abnormal; Notable for the following components:   Calcium 8.8 (*)    All other components within normal limits  RAPID URINE DRUG SCREEN, HOSP PERFORMED - Abnormal; Notable for the following components:   Tetrahydrocannabinol POSITIVE (*)    All other components within normal limits  PROTIME-INR  APTT  DIFFERENTIAL  URINALYSIS, ROUTINE W REFLEX MICROSCOPIC  ETHANOL  TROPONIN I    EKG EKG Interpretation  Date/Time:  Monday March 03 2018 19:39:24 EDT Ventricular Rate:  65 PR Interval:  152 QRS Duration: 84 QT Interval:  402 QTC Calculation: 418 R Axis:   82 Text Interpretation:  Normal sinus rhythm Normal ECG st elevation on prior ECG resolved Confirmed by Dorie Rank [phone]) on 03/03/2018 11:03:12 PM   Radiology Ct Head Wo Contrast  Result Date: 03/03/2018 CLINICAL DATA:  Dizziness and slurred speech starting Saturday which have resolved, former smoker EXAM: CT HEAD WITHOUT CONTRAST TECHNIQUE: Contiguous axial images were obtained from the base of the skull through the vertex without intravenous contrast. Sagittal and coronal MPR images reconstructed from axial data set. COMPARISON:  None FINDINGS: Brain: Normal ventricular morphology. No midline shift or mass effect. Normal appearance of brain parenchyma. No intracranial hemorrhage, mass lesion, evidence of acute infarction, or extra-axial fluid collection. Vascular: No hyperdense vessels Skull: Intact Sinuses/Orbits: Clear Other: N/A IMPRESSION: Normal exam. Electronically Signed   By: Lavonia Dana M.D.   On: 03/03/2018 20:17    Procedures Procedures (including critical care time)  Medications Ordered in ED Medications - No data to display   Initial Impression / Assessment and Plan / ED Course  I have reviewed the triage vital signs and the nursing notes.  Pertinent labs & imaging results that were available during my care of the patient were  reviewed by me and considered in my medical decision making (see chart for details).   Patient presented to the emergency room for evaluation of dizziness.  ED work-up is reassuring.  No evidence of clinically significant laboratory abnormalities.  CT scan does not show any acute findings.  Patient's neurologic exam is normal.  No signs to suggest stroke.  I suspect his symptoms are related to peripheral vertigo.  I did explain to the patient that I cannot completely exclude stroke based on his CT scan however his neurologic exam does not suggest a central cause of vertigo.  Plan on discharge home with prescription for meclizine.  Warning signs and precautions discussed.  Final Clinical Impressions(s) / ED Diagnoses   Final diagnoses:  Vertigo    ED Discharge Orders        Ordered    meclizine (ANTIVERT) 25 MG tablet  3 times daily PRN     03/03/18 2350  Dorie Rank, MD 03/03/18 [phone]

## 2019-04-05 IMAGING — CT CT HEAD W/O CM
3 series · 16 of 47 positions shown, 19 images · non-contrast
Comparison: None

CLINICAL DATA: Dizziness and slurred speech starting [REDACTED] which
have resolved, former smoker

EXAM:
CT HEAD WITHOUT CONTRAST
TECHNIQUE: Contiguous axial images were obtained from the base of the skull
through the vertex without intravenous contrast. Sagittal and
coronal MPR images reconstructed from axial data set.

[Series 2: head wo · axial · 0.43mm/px · z∈[+1583,+1713]mm · 10 of 32 slices shown, 13 images]
[im 3/32  brain]
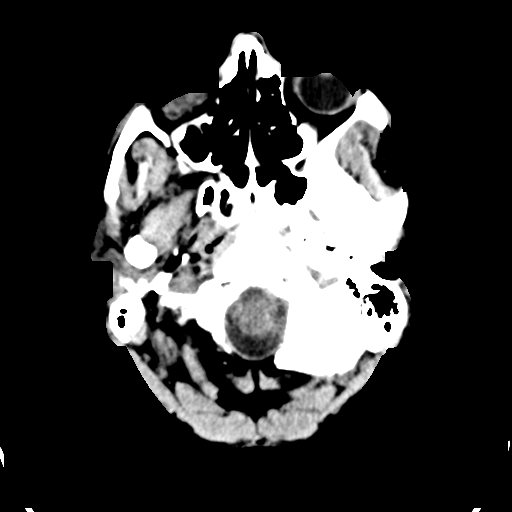
[im 3/32  bone]
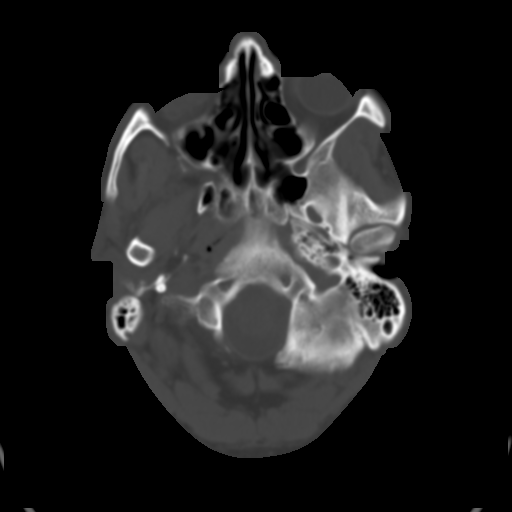
[im 6/32  brain]
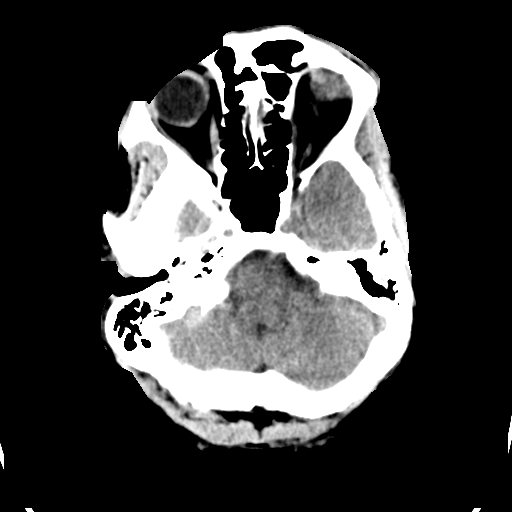
[im 9/32  brain]
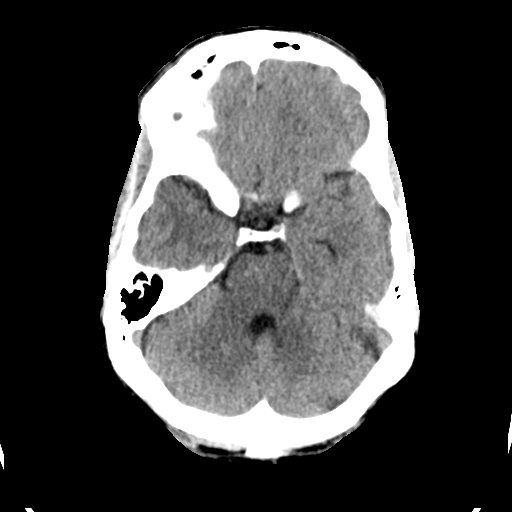
[im 11/32  brain]
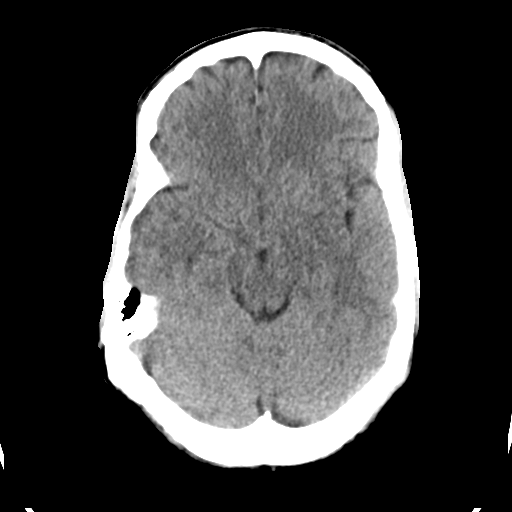
[im 14/32  brain]
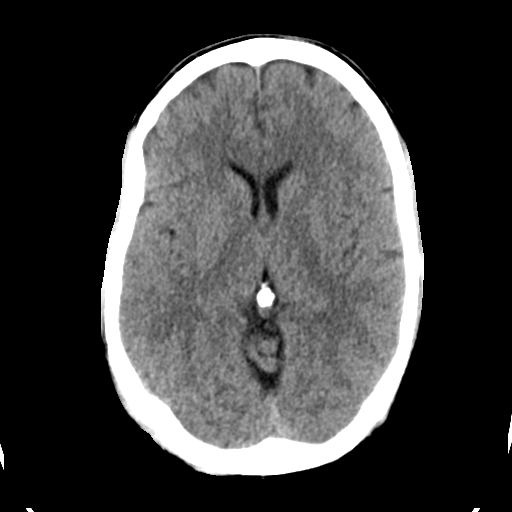
[im 14/32  bone]
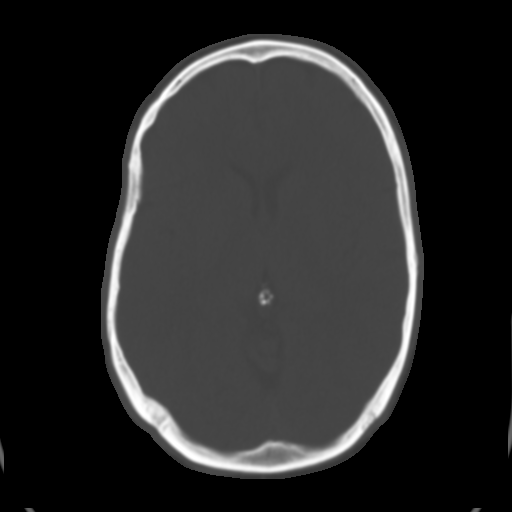
[im 18/32  brain]
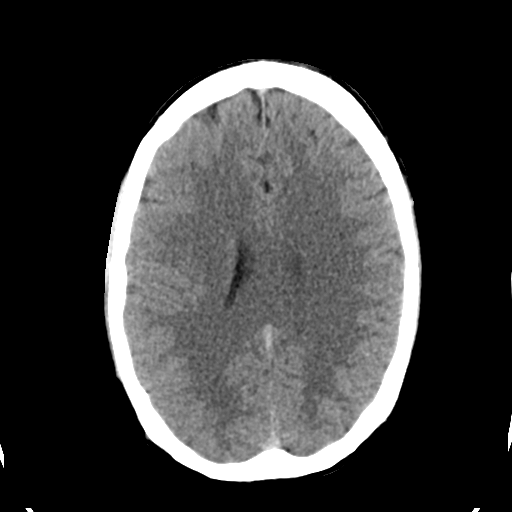
[im 21/32  brain]
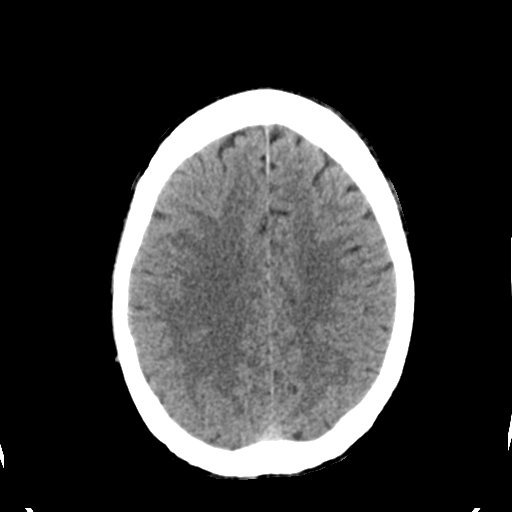
[im 24/32  brain]
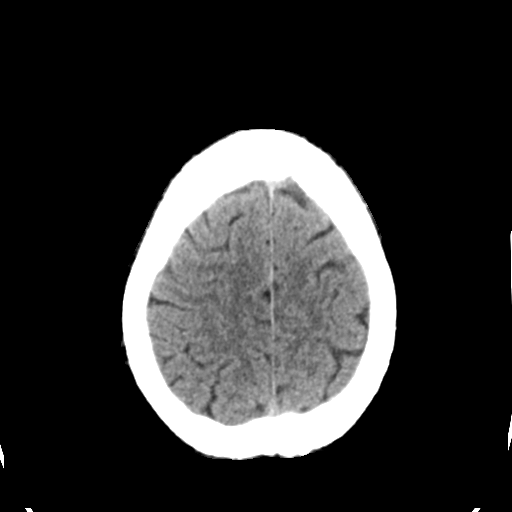
[im 26/32  brain]
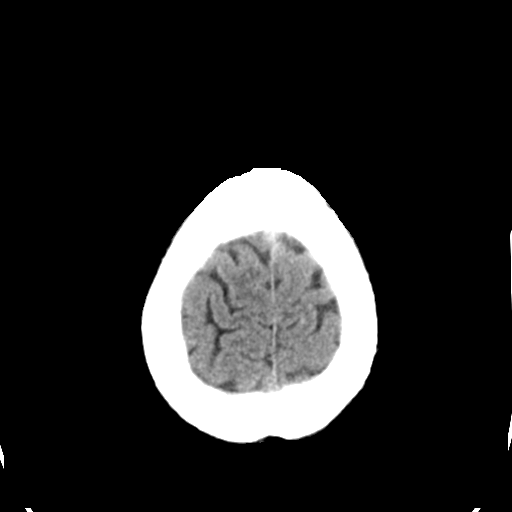
[im 26/32  bone]
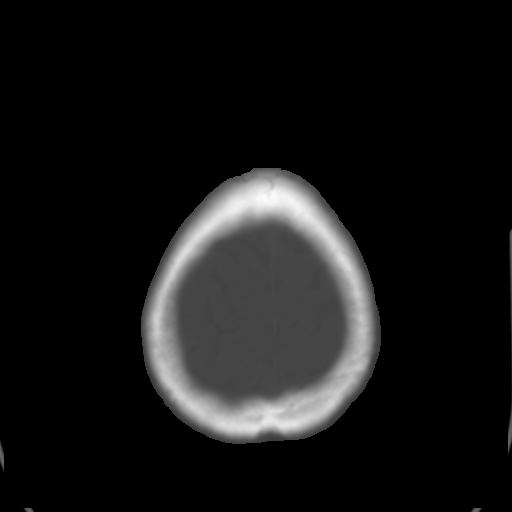
[im 29/32  brain]
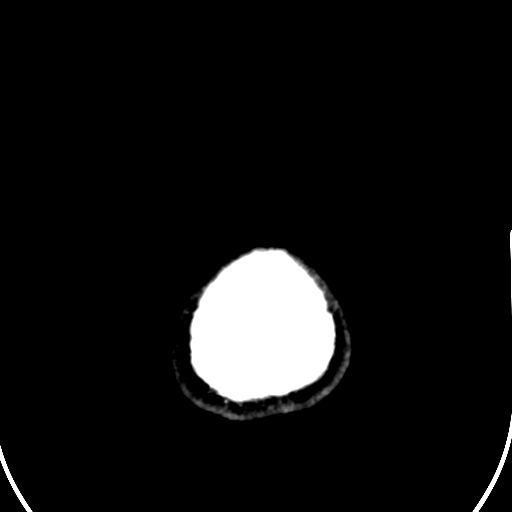

[Series 4: coronal soft tissue · coronal · 0.32mm/px · 3 of 73 slices shown]
[im 25/73  brain]
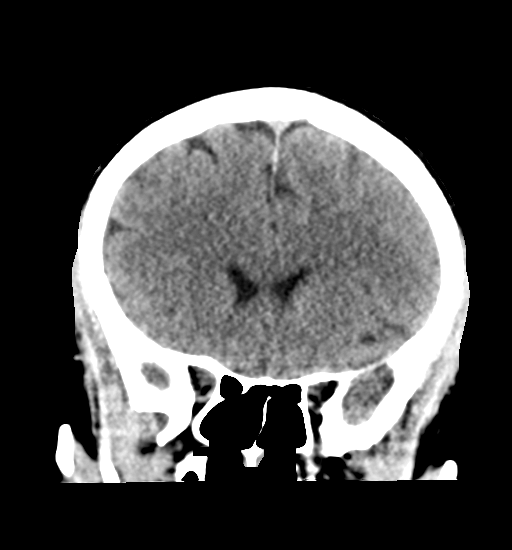
[im 33/73  brain]
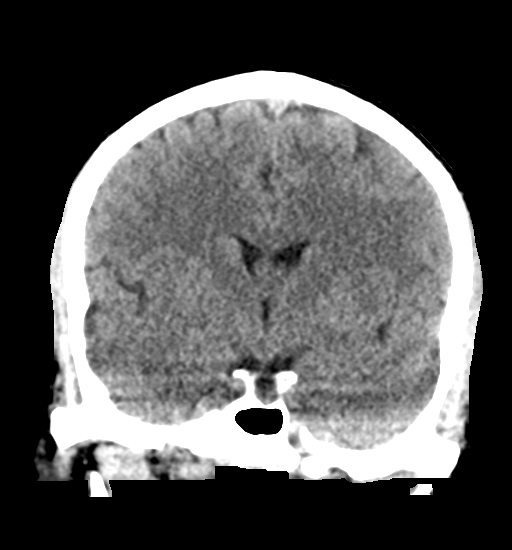
[im 41/73  brain]
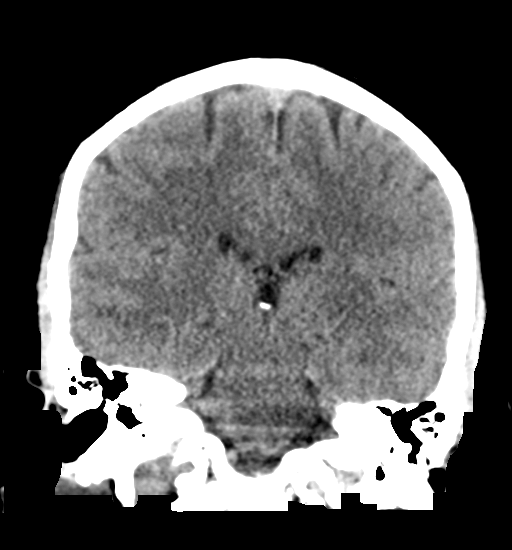

[Series 5: sagittal soft tissue · sagittal · 0.31mm/px · 3 of 52 slices shown]
[im 18/52  brain]
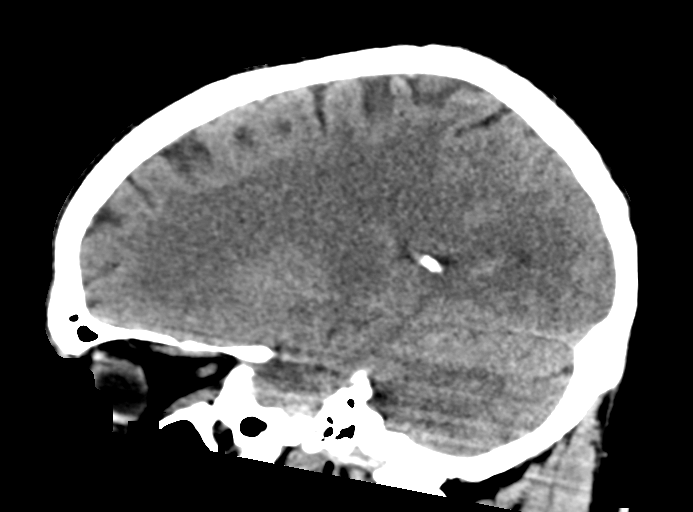
[im 26/52  brain]
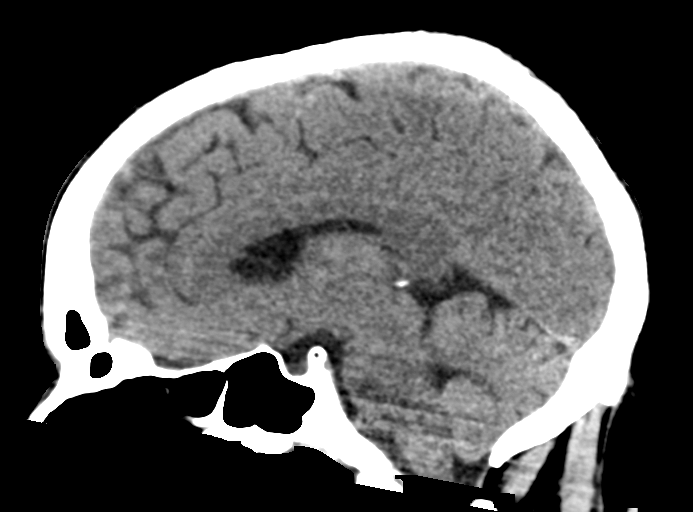
[im 35/52  brain]
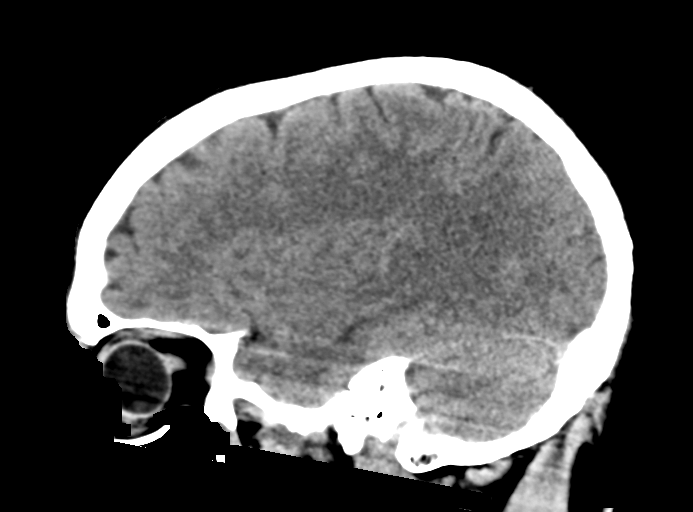

[16 of 47 positions shown; findings below may reference images not displayed]

FINDINGS: Brain: Normal ventricular morphology. No midline shift or mass
effect. Normal appearance of brain parenchyma. No intracranial
hemorrhage, mass lesion, evidence of acute infarction, or
extra-axial fluid collection.

Vascular: No hyperdense vessels

Skull: Intact

Sinuses/Orbits: Clear

Other: N/A
IMPRESSION: Normal exam.

## 2019-08-28 ENCOUNTER — Ambulatory Visit: Payer: PRIVATE HEALTH INSURANCE | Attending: Internal Medicine

## 2019-08-28 ENCOUNTER — Other Ambulatory Visit: Payer: Self-pay

## 2019-08-28 DIAGNOSIS — Z20822 Contact with and (suspected) exposure to covid-19: Secondary | ICD-10-CM

## 2019-08-29 LAB — NOVEL CORONAVIRUS, NAA: SARS-CoV-2, NAA: NOT DETECTED

## 2019-09-28 ENCOUNTER — Other Ambulatory Visit: Payer: Self-pay

## 2019-09-28 ENCOUNTER — Ambulatory Visit: Payer: PRIVATE HEALTH INSURANCE | Attending: Internal Medicine

## 2019-09-28 DIAGNOSIS — Z20822 Contact with and (suspected) exposure to covid-19: Secondary | ICD-10-CM

## 2019-09-29 LAB — NOVEL CORONAVIRUS, NAA: SARS-CoV-2, NAA: NOT DETECTED

## 2021-05-12 ENCOUNTER — Ambulatory Visit: Payer: Self-pay | Admitting: Registered Nurse

## 2021-07-13 ENCOUNTER — Encounter: Payer: Self-pay | Admitting: Registered Nurse

## 2021-07-13 ENCOUNTER — Ambulatory Visit (INDEPENDENT_AMBULATORY_CARE_PROVIDER_SITE_OTHER): Payer: Self-pay | Admitting: Registered Nurse

## 2021-07-13 VITALS — BP 122/74 | HR 88 | Temp 98.0°F | Wt 172.0 lb

## 2021-07-13 DIAGNOSIS — Z13220 Encounter for screening for lipoid disorders: Secondary | ICD-10-CM

## 2021-07-13 DIAGNOSIS — Z130 Encounter for screening for diseases of the blood and blood-forming organs and certain disorders involving the immune mechanism: Secondary | ICD-10-CM

## 2021-07-13 DIAGNOSIS — Z125 Encounter for screening for malignant neoplasm of prostate: Secondary | ICD-10-CM

## 2021-07-13 DIAGNOSIS — Z13228 Encounter for screening for other metabolic disorders: Secondary | ICD-10-CM

## 2021-07-13 DIAGNOSIS — Z1211 Encounter for screening for malignant neoplasm of colon: Secondary | ICD-10-CM

## 2021-07-13 DIAGNOSIS — G479 Sleep disorder, unspecified: Secondary | ICD-10-CM

## 2021-07-13 DIAGNOSIS — M25621 Stiffness of right elbow, not elsewhere classified: Secondary | ICD-10-CM

## 2021-07-13 DIAGNOSIS — Z0000 Encounter for general adult medical examination without abnormal findings: Secondary | ICD-10-CM

## 2021-07-13 DIAGNOSIS — Z1329 Encounter for screening for other suspected endocrine disorder: Secondary | ICD-10-CM

## 2021-07-13 LAB — COMPREHENSIVE METABOLIC PANEL
ALT: 14 U/L (ref 0–53)
AST: 20 U/L (ref 0–37)
Albumin: 4.5 g/dL (ref 3.5–5.2)
Alkaline Phosphatase: 60 U/L (ref 39–117)
BUN: 13 mg/dL (ref 6–23)
CO2: 29 mEq/L (ref 19–32)
Calcium: 9.8 mg/dL (ref 8.4–10.5)
Chloride: 105 mEq/L (ref 96–112)
Creatinine, Ser: 0.88 mg/dL (ref 0.40–1.50)
GFR: 95.79 mL/min (ref >60.00)
Glucose, Bld: 63 mg/dL — ABNORMAL LOW (ref 70–99)
Potassium: 4.2 mEq/L (ref 3.5–5.1)
Sodium: 140 mEq/L (ref 135–145)
Total Bilirubin: 0.6 mg/dL (ref 0.2–1.2)
Total Protein: 7.3 g/dL (ref 6.0–8.3)

## 2021-07-13 LAB — LIPID PANEL
Cholesterol: 179 mg/dL (ref 0–200)
HDL: 54 mg/dL (ref >39.00)
LDL Cholesterol: 110 mg/dL — ABNORMAL HIGH (ref 0–99)
NonHDL: 125.44
Total CHOL/HDL Ratio: 3
Triglycerides: 76 mg/dL (ref 0.0–149.0)
VLDL: 15.2 mg/dL (ref 0.0–40.0)

## 2021-07-13 LAB — CBC WITH DIFFERENTIAL/PLATELET
Basophils Absolute: 0 10*3/uL (ref 0.0–0.1)
Basophils Relative: 0.4 % (ref 0.0–3.0)
Eosinophils Absolute: 0.2 10*3/uL (ref 0.0–0.7)
Eosinophils Relative: 2.3 % (ref 0.0–5.0)
HCT: 40.1 % (ref 39.0–52.0)
Hemoglobin: 13.5 g/dL (ref 13.0–17.0)
Lymphocytes Relative: 25.2 % (ref 12.0–46.0)
Lymphs Abs: 2 10*3/uL (ref 0.7–4.0)
MCHC: 33.6 g/dL (ref 30.0–36.0)
MCV: 91.6 fl (ref 78.0–100.0)
Monocytes Absolute: 0.6 10*3/uL (ref 0.1–1.0)
Monocytes Relative: 7.9 % (ref 3.0–12.0)
Neutro Abs: 5.1 10*3/uL (ref 1.4–7.7)
Neutrophils Relative %: 64.2 % (ref 43.0–77.0)
Platelets: 249 10*3/uL (ref 150.0–400.0)
RBC: 4.37 Mil/uL (ref 4.22–5.81)
RDW: 13.6 % (ref 11.5–15.5)
WBC: 7.9 10*3/uL (ref 4.0–10.5)

## 2021-07-13 LAB — HEMOGLOBIN A1C: Hgb A1c MFr Bld: 5.1 % (ref 4.6–6.5)

## 2021-07-13 LAB — PSA: PSA: 1.96 ng/mL (ref 0.10–4.00)

## 2021-07-13 MED ORDER — TRAZODONE HCL 50 MG PO TABS
25.0000 mg | ORAL_TABLET | Freq: Every evening | ORAL | 3 refills | Status: DC | PRN
Start: 2021-07-13 — End: 2021-07-20

## 2021-07-13 NOTE — Progress Notes (Signed)
New Patient Office Visit  Subjective:  Patient ID: Joel Evans, male    DOB: 03/02/1966  Age: 55 y.o. MRN: [id number]  CC:  Chief Complaint  Patient presents with   New Patient (Initial Visit)    HPI Joel Evans presents for CPE  Notes limited ROM in elbow. No hx of injury. R elbow. Cannot extend fully - about 160 deg. Max. No pain or swelling.  Notes sleep disturbance - no snoring, good sleep hygiene. Has not used OTC supplements. No anxiety Wakes frequently, trouble falling back asleep Active during the day.   Histories reviewed and updated with patient.    History reviewed. No pertinent past medical history.  History reviewed. No pertinent surgical history.  Family History  Problem Relation Age of Onset   Hypertension Mother    Hypertension Father     Social History   Socioeconomic History   Marital status: Married    Spouse name: Not on file   Number of children: 4   Years of education: Not on file   Highest education level: Not on file  Occupational History   Not on file  Tobacco Use   Smoking status: Former    Packs/day: 0.50    Years: 40.00    Pack years: 20.00    Types: Cigarettes    Quit date: 2018    Years since quitting: 4.9   Smokeless tobacco: Never  Vaping Use   Vaping Use: Never used  Substance and Sexual Activity   Alcohol use: No   Drug use: No   Sexual activity: Not Currently  Other Topics Concern   Not on file  Social History Narrative   Not on file   Social Determinants of Health   Financial Resource Strain: Not on file  Food Insecurity: Not on file  Transportation Needs: Not on file  Physical Activity: Not on file  Stress: Not on file  Social Connections: Not on file  Intimate Partner Violence: Not on file    ROS Review of Systems  Constitutional: Negative.   HENT: Negative.    Eyes: Negative.   Respiratory: Negative.    Cardiovascular: Negative.   Gastrointestinal: Negative.   Genitourinary: Negative.    Musculoskeletal: Negative.   Skin: Negative.   Neurological: Negative.   Psychiatric/Behavioral: Negative.    All other systems reviewed and are negative.  Objective:   Today's Vitals: BP 122/74 (BP Location: Left Arm, Patient Position: Sitting, Cuff Size: Normal)   Pulse 88   Temp 98 F (36.7 C) (Oral)   Wt 172 lb (78 kg)   SpO2 96%   BMI 23.33 kg/m   Physical Exam Vitals and nursing note reviewed.  Constitutional:      General: He is not in acute distress.    Appearance: Normal appearance. He is obese. He is not ill-appearing, toxic-appearing or diaphoretic.  HENT:     Head: Normocephalic and atraumatic.     Right Ear: Tympanic membrane, ear canal and external ear normal. There is no impacted cerumen.     Left Ear: Tympanic membrane, ear canal and external ear normal. There is no impacted cerumen.     Nose: Nose normal. No congestion or rhinorrhea.     Mouth/Throat:     Mouth: Mucous membranes are moist.     Pharynx: Oropharynx is clear. No oropharyngeal exudate or posterior oropharyngeal erythema.  Eyes:     General: No scleral icterus.       Right eye: No discharge.  Left eye: No discharge.     Extraocular Movements: Extraocular movements intact.     Conjunctiva/sclera: Conjunctivae normal.     Pupils: Pupils are equal, round, and reactive to light.  Neck:     Vascular: No carotid bruit.  Cardiovascular:     Rate and Rhythm: Normal rate and regular rhythm.     Pulses: Normal pulses.     Heart sounds: Normal heart sounds. No murmur heard.   No friction rub. No gallop.  Pulmonary:     Effort: Pulmonary effort is normal. No respiratory distress.     Breath sounds: Normal breath sounds. No stridor. No wheezing, rhonchi or rales.  Chest:     Chest wall: No tenderness.  Abdominal:     General: Abdomen is flat. Bowel sounds are normal. There is no distension.     Palpations: Abdomen is soft. There is no mass.     Tenderness: There is no abdominal tenderness.  There is no right CVA tenderness, left CVA tenderness, guarding or rebound.     Hernia: No hernia is present.  Musculoskeletal:        General: No swelling, tenderness, deformity or signs of injury. Normal range of motion.     Cervical back: Normal range of motion and neck supple. No rigidity or tenderness.     Right lower leg: No edema.     Left lower leg: No edema.  Lymphadenopathy:     Cervical: No cervical adenopathy.  Skin:    General: Skin is warm and dry.     Capillary Refill: Capillary refill takes less than 2 seconds.     Coloration: Skin is not jaundiced or pale.     Findings: No bruising, erythema, lesion or rash.  Neurological:     General: No focal deficit present.     Mental Status: He is alert and oriented to person, place, and time. Mental status is at baseline.     Cranial Nerves: No cranial nerve deficit.     Sensory: No sensory deficit.     Motor: No weakness.     Coordination: Coordination normal.     Gait: Gait normal.     Deep Tendon Reflexes: Reflexes normal.  Psychiatric:        Mood and Affect: Mood normal.        Behavior: Behavior normal.        Thought Content: Thought content normal.        Judgment: Judgment normal.    Assessment & Plan:   Problem List Items Addressed This Visit   None Visit Diagnoses     Annual physical exam    -  Primary   Sleep disturbance       Relevant Medications   traZODone (DESYREL) 50 MG tablet   Screening for endocrine, metabolic and immunity disorder       Relevant Orders   Hemoglobin A1c (Completed)   Comprehensive metabolic panel (Completed)   CBC with Differential/Platelet (Completed)   TSH (Completed)   Lipid screening       Relevant Orders   Lipid panel (Completed)   Colon cancer screening       Relevant Orders   Cologuard   Prostate cancer screening       Relevant Orders   PSA (Completed)   Decreased range of motion of right elbow       Relevant Orders   DG Elbow 2 Views Right        Outpatient Encounter Medications as of 07/13/2021  Medication Sig   meclizine (ANTIVERT) 25 MG tablet Take 1 tablet (25 mg total) by mouth 3 (three) times daily as needed for dizziness.   traZODone (DESYREL) 50 MG tablet Take 0.5-1 tablets (25-50 mg total) by mouth at bedtime as needed for sleep.   No facility-administered encounter medications on file as of 07/13/2021.    Follow-up: Return in about 1 year (around 07/13/2022) for CPE and labs.   PLAN Exam unremarkable Labs collected. Will follow up with the patient as warranted. Order dg elbow for limited ROM  Trazodone 25-50mg  po qhs prn for sleep Patient encouraged to call clinic with any questions, comments, or concerns.  Maximiano Coss, NP

## 2021-07-13 NOTE — Patient Instructions (Signed)
Mr. Spease -   Doristine Devoid to meet you  Exam is reassuring.  I have ordered xray of elbow to [place] at [facility] in [place]. They'll call you to schedule.  I have ordered a cologuard kit - this will be mailed to your home.   I have ordered labs - I'll let you know results by tomorrow  See you annually for a physical, sooner if you need anything  Thanks,   Denice Paradise

## 2021-07-14 LAB — TSH: TSH: 0.77 u[IU]/mL (ref 0.35–5.50)

## 2021-07-19 ENCOUNTER — Telehealth: Payer: Self-pay

## 2021-07-19 NOTE — Telephone Encounter (Signed)
Caller name:Romy Wynetta Emery   On DPR? :No  Call back number:[phone]  Provider they see: Richard   Reason for call:Medication for traZODone (DESYREL) 50 MG tablet  was sent to wrong pharmacy needs to go to [facility]

## 2021-07-20 ENCOUNTER — Other Ambulatory Visit: Payer: Self-pay

## 2021-07-20 DIAGNOSIS — G479 Sleep disorder, unspecified: Secondary | ICD-10-CM

## 2021-07-20 MED ORDER — TRAZODONE HCL 50 MG PO TABS: 25.0000 mg | ORAL_TABLET | Freq: Every evening | ORAL | 3 refills | Status: DC | PRN

## 2021-07-20 NOTE — Telephone Encounter (Signed)
Patient pharmacy was changed for medication

## 2022-02-20 ENCOUNTER — Ambulatory Visit (INDEPENDENT_AMBULATORY_CARE_PROVIDER_SITE_OTHER): Payer: 59 | Admitting: Registered Nurse

## 2022-02-20 ENCOUNTER — Other Ambulatory Visit: Payer: Self-pay

## 2022-02-20 ENCOUNTER — Encounter: Payer: Self-pay | Admitting: Registered Nurse

## 2022-02-20 VITALS — BP 122/66 | HR 66 | Temp 98.4°F | Resp 18 | Ht 72.0 in | Wt 175.0 lb

## 2022-02-20 DIAGNOSIS — M25621 Stiffness of right elbow, not elsewhere classified: Secondary | ICD-10-CM | POA: Diagnosis not present

## 2022-02-20 DIAGNOSIS — K4090 Unilateral inguinal hernia, without obstruction or gangrene, not specified as recurrent: Secondary | ICD-10-CM | POA: Diagnosis not present

## 2022-02-20 MED ORDER — CYCLOBENZAPRINE HCL 5 MG PO TABS: 5.0000 mg | ORAL_TABLET | Freq: Three times a day (TID) | ORAL | 1 refills | Status: DC | PRN

## 2022-02-20 NOTE — Progress Notes (Signed)
Acute Office Visit  Subjective:    Patient ID: Joel Evans, male    DOB: Oct 26, 1965, 56 y.o.   MRN: [id number]  Chief Complaint  Patient presents with   Pain    Patient states he is having pain in the right arm and also in the groin think he might have a hernia.    HPI Patient is in today for R elbow pain, R groin pain  Elbow Has noted decreased ROM with occ pain in posterior elbow No pain with movement but notes pain when resting on hard surface No acute injury or trauma in the past. No radiation of pain Full ROM and 5/5 strength in shoulder and wrist No changes in sensation.  R groin Onset over past few months Notes it's worse when he's doing heavier physical work No changes to bowel habits Some swelling in R inguinal area No testicular pain or swelling No urinary changes.   Otherwise, no concerns.  Outpatient Medications Prior to Visit  Medication Sig Dispense Refill   meclizine (ANTIVERT) 25 MG tablet Take 1 tablet (25 mg total) by mouth 3 (three) times daily as needed for dizziness. 30 tablet 0   traZODone (DESYREL) 50 MG tablet Take 0.5-1 tablets (25-50 mg total) by mouth at bedtime as needed for sleep. 30 tablet 3   No facility-administered medications prior to visit.    Review of Systems Per hpi      Objective:    BP 122/66   Pulse 66   Temp 98.4 F (36.9 C) (Temporal)   Resp 18   Ht 6' (1.829 m)   Wt 175 lb (79.4 kg)   SpO2 98%   BMI 23.73 kg/m  Physical Exam Constitutional:      General: He is not in acute distress.    Appearance: Normal appearance. He is normal weight. He is not ill-appearing, toxic-appearing or diaphoretic.  Cardiovascular:     Rate and Rhythm: Normal rate and regular rhythm.     Heart sounds: Normal heart sounds. No murmur heard.    No friction rub. No gallop.  Pulmonary:     Effort: Pulmonary effort is normal. No respiratory distress.     Breath sounds: Normal breath sounds. No stridor. No wheezing, rhonchi or  rales.  Chest:     Chest wall: No tenderness.  Abdominal:     Hernia: A hernia (R inguinal) is present.  Musculoskeletal:     Comments: Limited ROM in R elbow in flexion and extension. 3/5 strength in R elbow. 3/5 grip strength R side.    Neurological:     General: No focal deficit present.     Mental Status: He is alert and oriented to person, place, and time. Mental status is at baseline.  Psychiatric:        Mood and Affect: Mood normal.        Behavior: Behavior normal.        Thought Content: Thought content normal.        Judgment: Judgment normal.     No results found for any visits on 02/20/22.      Assessment & Plan:  1. Decreased range of motion of right elbow - DG Elbow Complete Right; Future - cyclobenzaprine (FLEXERIL) 5 MG tablet; Take 1 tablet (5 mg total) by mouth 3 (three) times daily as needed for muscle spasms.  Dispense: 30 tablet; Refill: 1  2. Right inguinal hernia - Ambulatory referral to General Surgery - cyclobenzaprine (FLEXERIL) 5 MG tablet; Take  1 tablet (5 mg total) by mouth 3 (three) times daily as needed for muscle spasms.  Dispense: 30 tablet; Refill: 1    Meds ordered this encounter  Medications   cyclobenzaprine (FLEXERIL) 5 MG tablet    Sig: Take 1 tablet (5 mg total) by mouth 3 (three) times daily as needed for muscle spasms.    Dispense:  30 tablet    Refill:  1    Order Specific Question:   Supervising Provider    Answer:   Carlota Raspberry, JEFFREY R [[id number]]    Return if symptoms worsen or fail to improve.  PLAN Unsure of elbow - will get xray. Low threshold for [place] referral R inguinal hernia, refer to gen surg. Flexeril for pain Patient encouraged to call clinic with any questions, comments, or concerns.   Maximiano Coss, NP

## 2022-02-20 NOTE — Patient Instructions (Addendum)
Mr. Bomkamp-  Doristine Devoid to see you  Odette Horns is at [facility] in [place]. They take walk ins - get this done in the next couple of days if you can.  General Surgery will call you regarding the hernia  I'll be in touch with results  Thanks,  Rich        I recommend:  Berniece Pap, MD Dimas Chyle, MD Agustina Caroli, MD Myrna Blazer Early, NP Jeralyn Ruths, DNP     If you have lab work done today you will be contacted with your lab results within the next 2 weeks.  If you have not heard from [place] then please contact us. The fastest way to get your results is to register for My Chart.   IF you received an x-ray today, you will receive an invoice from [facility]. Please contact [facility] at [phone] with questions or concerns regarding your invoice.   IF you received labwork today, you will receive an invoice from [place]. Please contact [facility] at [phone] with questions or concerns regarding your invoice.   Our billing staff will not be able to assist you with questions regarding bills from these companies.  You will be contacted with the lab results as soon as they are available. The fastest way to get your results is to activate your My Chart account. Instructions are located on the last page of this paperwork. If you have not heard from [place] regarding the results in 2 weeks, please contact this office.

## 2022-02-28 ENCOUNTER — Encounter: Payer: Self-pay | Admitting: Registered Nurse

## 2022-02-28 ENCOUNTER — Other Ambulatory Visit: Payer: Self-pay

## 2022-02-28 ENCOUNTER — Ambulatory Visit (INDEPENDENT_AMBULATORY_CARE_PROVIDER_SITE_OTHER): Payer: 59 | Admitting: Registered Nurse

## 2022-02-28 VITALS — BP 112/66 | HR 87 | Temp 98.0°F | Resp 18 | Ht 72.0 in | Wt 174.4 lb

## 2022-02-28 DIAGNOSIS — Z1211 Encounter for screening for malignant neoplasm of colon: Secondary | ICD-10-CM

## 2022-02-28 DIAGNOSIS — Z130 Encounter for screening for diseases of the blood and blood-forming organs and certain disorders involving the immune mechanism: Secondary | ICD-10-CM | POA: Diagnosis not present

## 2022-02-28 DIAGNOSIS — Z0000 Encounter for general adult medical examination without abnormal findings: Secondary | ICD-10-CM

## 2022-02-28 DIAGNOSIS — Z13220 Encounter for screening for lipoid disorders: Secondary | ICD-10-CM | POA: Diagnosis not present

## 2022-02-28 DIAGNOSIS — Z1329 Encounter for screening for other suspected endocrine disorder: Secondary | ICD-10-CM

## 2022-02-28 DIAGNOSIS — Z1159 Encounter for screening for other viral diseases: Secondary | ICD-10-CM | POA: Diagnosis not present

## 2022-02-28 DIAGNOSIS — Z13228 Encounter for screening for other metabolic disorders: Secondary | ICD-10-CM | POA: Diagnosis not present

## 2022-02-28 DIAGNOSIS — Z125 Encounter for screening for malignant neoplasm of prostate: Secondary | ICD-10-CM

## 2022-02-28 NOTE — Progress Notes (Signed)
Complete physical exam  Patient: Joel Evans   DOB: April 10, 1966   56 y.o. Male  MRN: [id number] Visit Date: 02/28/2022  Subjective:    Chief Complaint  Patient presents with   Annual Exam    Patient states he is here for a CPE    [name] is a 56 y.o. male who presents today for a complete physical exam. He reports consuming a general diet.     He generally feels well. He reports sleeping well. He does not have additional problems to discuss today.   Vision:Within the last year Dental:Within Last 6 months STD Screen:No PSA:No  HM Due for colon can screen - opts for cologuard, will send PSA collected today.  Most recent fall risk assessment:    02/28/2022    3:40 PM  [place] in the past year? 0  Number falls in past yr: 0  Injury with Fall? 0  Risk for fall due to : No Fall Risks  Follow up Falls evaluation completed     Most recent depression screenings:    02/28/2022    3:40 PM 02/20/2022    2:43 PM  PHQ 2/9 Scores  PHQ - 2 Score 0 1  PHQ- 9 Score 1 5     There are no problems to display for this patient.  History reviewed. No pertinent past medical history. History reviewed. No pertinent surgical history. Social History   Tobacco Use   Smoking status: Former    Packs/day: 0.50    Years: 40.00    Total pack years: 20.00    Types: Cigarettes    Quit date: 2018    Years since quitting: 5.5   Smokeless tobacco: Never  Vaping Use   Vaping Use: Never used  Substance Use Topics   Alcohol use: No   Drug use: No   Social History   Socioeconomic History   Marital status: Married    Spouse name: Not on file   Number of children: 4   Years of education: Not on file   Highest education level: Not on file  Occupational History   Not on file  Tobacco Use   Smoking status: Former    Packs/day: 0.50    Years: 40.00    Total pack years: 20.00    Types: Cigarettes    Quit date: 2018    Years since quitting: 5.5   Smokeless  tobacco: Never  Vaping Use   Vaping Use: Never used  Substance and Sexual Activity   Alcohol use: No   Drug use: No   Sexual activity: Not Currently  Other Topics Concern   Not on file  Social History Narrative   Not on file   Social Determinants of Health   Financial Resource Strain: Not on file  Food Insecurity: Not on file  Transportation Needs: Not on file  Physical Activity: Not on file  Stress: Not on file  Social Connections: Not on file  Intimate Partner Violence: Not on file   Family Status  Relation Name Status   Mother  (Not Specified)   Father  (Not Specified)   Family History  Problem Relation Age of Onset   Hypertension Mother    Hypertension Father    No Known Allergies   Patient Care Team: Maximiano Coss, NP as PCP - General (Adult Health Nurse Practitioner)   Medications: Outpatient Medications Prior to Visit  Medication Sig   cyclobenzaprine (FLEXERIL) 5 MG tablet Take 1 tablet (  5 mg total) by mouth 3 (three) times daily as needed for muscle spasms.   meclizine (ANTIVERT) 25 MG tablet Take 1 tablet (25 mg total) by mouth 3 (three) times daily as needed for dizziness.   traZODone (DESYREL) 50 MG tablet Take 0.5-1 tablets (25-50 mg total) by mouth at bedtime as needed for sleep.   No facility-administered medications prior to visit.    Review of Systems  Constitutional: Negative.   HENT: Negative.    Eyes: Negative.   Respiratory: Negative.    Cardiovascular: Negative.   Gastrointestinal: Negative.   Genitourinary: Negative.   Musculoskeletal: Negative.   Skin: Negative.   Neurological: Negative.   Psychiatric/Behavioral: Negative.    All other systems reviewed and are negative.   Last CBC Lab Results  Component Value Date   WBC 7.9 07/13/2021   HGB 13.5 07/13/2021   HCT 40.1 07/13/2021   MCV 91.6 07/13/2021   MCH 31.7 03/03/2018   RDW 13.6 07/13/2021   PLT 249.0 08/67/6195   Last metabolic panel Lab Results  Component Value  Date   GLUCOSE 63 (L) 07/13/2021   NA 140 07/13/2021   K 4.2 07/13/2021   CL 105 07/13/2021   CO2 29 07/13/2021   BUN 13 07/13/2021   CREATININE 0.88 07/13/2021   GFRNONAA >60 03/03/2018   CALCIUM 9.8 07/13/2021   PROT 7.3 07/13/2021   ALBUMIN 4.5 07/13/2021   BILITOT 0.6 07/13/2021   ALKPHOS 60 07/13/2021   AST 20 07/13/2021   ALT 14 07/13/2021   ANIONGAP 8 03/03/2018   Last lipids Lab Results  Component Value Date   CHOL 179 07/13/2021   HDL 54.00 07/13/2021   LDLCALC 110 (H) 07/13/2021   TRIG 76.0 07/13/2021   CHOLHDL 3 07/13/2021   Last hemoglobin A1c Lab Results  Component Value Date   HGBA1C 5.1 07/13/2021   Last thyroid functions Lab Results  Component Value Date   TSH 0.77 07/13/2021   Last vitamin D No results found for: "25OHVITD2", "25OHVITD3", "VD25OH" Last vitamin B12 and Folate No results found for: "VITAMINB12", "FOLATE"      Objective:     BP 112/66   Pulse 87   Temp 98 F (36.7 C) (Temporal)   Resp 18   Ht 6' (1.829 m)   Wt 174 lb 6.4 oz (79.1 kg)   SpO2 98%   BMI 23.65 kg/m   BP Readings from Last 3 Encounters:  02/28/22 112/66  02/20/22 122/66  07/13/21 122/74   Wt Readings from Last 3 Encounters:  02/28/22 174 lb 6.4 oz (79.1 kg)  02/20/22 175 lb (79.4 kg)  07/13/21 172 lb (78 kg)   SpO2 Readings from Last 3 Encounters:  02/28/22 98%  02/20/22 98%  07/13/21 96%      Physical Exam Vitals and nursing note reviewed.  Constitutional:      General: He is not in acute distress.    Appearance: Normal appearance. He is not ill-appearing, toxic-appearing or diaphoretic.  HENT:     Head: Normocephalic and atraumatic.     Right Ear: Tympanic membrane, ear canal and external ear normal. There is no impacted cerumen.     Left Ear: Tympanic membrane, ear canal and external ear normal. There is no impacted cerumen.     Nose: Nose normal. No congestion or rhinorrhea.     Mouth/Throat:     Mouth: Mucous membranes are moist.      Pharynx: Oropharynx is clear. No oropharyngeal exudate or posterior oropharyngeal erythema.  Eyes:  General: No scleral icterus.       Right eye: No discharge.        Left eye: No discharge.     Extraocular Movements: Extraocular movements intact.     Conjunctiva/sclera: Conjunctivae normal.     Pupils: Pupils are equal, round, and reactive to light.  Neck:     Vascular: No carotid bruit.  Cardiovascular:     Rate and Rhythm: Normal rate and regular rhythm.     Pulses: Normal pulses.     Heart sounds: Normal heart sounds. No murmur heard.    No friction rub. No gallop.  Pulmonary:     Effort: Pulmonary effort is normal. No respiratory distress.     Breath sounds: Normal breath sounds. No stridor. No wheezing, rhonchi or rales.  Chest:     Chest wall: No tenderness.  Abdominal:     General: Abdomen is flat. Bowel sounds are normal. There is no distension.     Palpations: Abdomen is soft. There is no mass.     Tenderness: There is no abdominal tenderness. There is no right CVA tenderness, left CVA tenderness, guarding or rebound.     Hernia: No hernia is present.  Musculoskeletal:        General: No swelling, tenderness, deformity or signs of injury. Normal range of motion.     Cervical back: Normal range of motion and neck supple. No rigidity or tenderness.     Right lower leg: No edema.     Left lower leg: No edema.  Lymphadenopathy:     Cervical: No cervical adenopathy.  Skin:    General: Skin is warm and dry.     Capillary Refill: Capillary refill takes less than 2 seconds.     Coloration: Skin is not jaundiced or pale.     Findings: No bruising, erythema, lesion or rash.  Neurological:     General: No focal deficit present.     Mental Status: He is alert and oriented to person, place, and time. Mental status is at baseline.     Cranial Nerves: No cranial nerve deficit.     Sensory: No sensory deficit.     Motor: No weakness.     Coordination: Coordination normal.      Gait: Gait normal.     Deep Tendon Reflexes: Reflexes normal.  Psychiatric:        Mood and Affect: Mood normal.        Behavior: Behavior normal.        Thought Content: Thought content normal.        Judgment: Judgment normal.      No results found for any visits on 02/28/22.    Assessment & Plan:    Routine Health Maintenance and Physical Exam   There is no immunization history on file for this patient.  Health Maintenance  Topic Date Due   HIV Screening  Never done   COLONOSCOPY (Pts 45-30yr Insurance coverage will need to be confirmed)  Never done   COVID-19 Vaccine (1) 03/16/2022 (Originally 01/06/1967)   Zoster Vaccines- Shingrix (1 of 2) 05/31/2022 (Originally 07/08/2014)   Hepatitis C Screening  03/01/2023 (Originally 07/08/1982)   INFLUENZA VACCINE  03/06/2022   TETANUS/TDAP  12/06/2029   HPV VACCINES  Aged Out    Discussed health benefits of physical activity, and encouraged him to engage in regular exercise appropriate for his age and condition.  Problem List Items Addressed This Visit   None Visit Diagnoses  Annual physical exam    -  Primary   Screening for endocrine, metabolic and immunity disorder       Relevant Orders   CBC with Differential/Platelet   Comprehensive metabolic panel   Hemoglobin A1c   Lipid screening       Relevant Orders   Lipid panel   Encounter for screening for other viral diseases       Relevant Orders   Hepatitis C antibody   HIV antibody (with reflex)   Colon cancer screening       Relevant Orders   Cologuard   Screening PSA (prostate specific antigen)       Relevant Orders   PSA      Return in about 1 year (around 03/01/2023) for CPE and labs.     PLAN Exam unremarkable Labs collected. Will follow up with the patient as warranted. Patient encouraged to call clinic with any questions, comments, or concerns.   Maximiano Coss, NP

## 2022-02-28 NOTE — Patient Instructions (Addendum)
Joel Evans-   Normal exam today. You seem like you're in great health!  Stay well. I'll call if there are concerns on labs  Thank you,  Rich     If you have lab work done today you will be contacted with your lab results within the next 2 weeks.  If you have not heard from [place] then please contact us. The fastest way to get your results is to register for My Chart.   IF you received an x-ray today, you will receive an invoice from [facility]. Please contact [facility] at [phone] with questions or concerns regarding your invoice.   IF you received labwork today, you will receive an invoice from [place]. Please contact [facility] at [phone] with questions or concerns regarding your invoice.   Our billing staff will not be able to assist you with questions regarding bills from these companies.  You will be contacted with the lab results as soon as they are available. The fastest way to get your results is to activate your My Chart account. Instructions are located on the last page of this paperwork. If you have not heard from [place] regarding the results in 2 weeks, please contact this office.

## 2022-03-01 LAB — LIPID PANEL
Cholesterol: 187 mg/dL (ref 0–200)
HDL: 51.8 mg/dL (ref >39.00)
LDL Cholesterol: 121 mg/dL — ABNORMAL HIGH (ref 0–99)
NonHDL: 134.96
Total CHOL/HDL Ratio: 4
Triglycerides: 72 mg/dL (ref 0.0–149.0)
VLDL: 14.4 mg/dL (ref 0.0–40.0)

## 2022-03-01 LAB — HEMOGLOBIN A1C: Hgb A1c MFr Bld: 5.3 % (ref 4.6–6.5)

## 2022-03-01 LAB — CBC WITH DIFFERENTIAL/PLATELET
Basophils Absolute: 0.1 10*3/uL (ref 0.0–0.1)
Basophils Relative: 0.9 % (ref 0.0–3.0)
Eosinophils Absolute: 0.2 10*3/uL (ref 0.0–0.7)
Eosinophils Relative: 2.2 % (ref 0.0–5.0)
HCT: 41.3 % (ref 39.0–52.0)
Hemoglobin: 13.9 g/dL (ref 13.0–17.0)
Lymphocytes Relative: 26.7 % (ref 12.0–46.0)
Lymphs Abs: 2.3 10*3/uL (ref 0.7–4.0)
MCHC: 33.7 g/dL (ref 30.0–36.0)
MCV: 91.9 fl (ref 78.0–100.0)
Monocytes Absolute: 0.6 10*3/uL (ref 0.1–1.0)
Monocytes Relative: 7 % (ref 3.0–12.0)
Neutro Abs: 5.5 10*3/uL (ref 1.4–7.7)
Neutrophils Relative %: 63.2 % (ref 43.0–77.0)
Platelets: 251 10*3/uL (ref 150.0–400.0)
RBC: 4.5 Mil/uL (ref 4.22–5.81)
RDW: 13.6 % (ref 11.5–15.5)
WBC: 8.6 10*3/uL (ref 4.0–10.5)

## 2022-03-01 LAB — COMPREHENSIVE METABOLIC PANEL
ALT: 12 U/L (ref 0–53)
AST: 19 U/L (ref 0–37)
Albumin: 4.7 g/dL (ref 3.5–5.2)
Alkaline Phosphatase: 67 U/L (ref 39–117)
BUN: 10 mg/dL (ref 6–23)
CO2: 28 mEq/L (ref 19–32)
Calcium: 9.6 mg/dL (ref 8.4–10.5)
Chloride: 104 mEq/L (ref 96–112)
Creatinine, Ser: 0.95 mg/dL (ref 0.40–1.50)
GFR: 88.77 mL/min (ref >60.00)
Glucose, Bld: 79 mg/dL (ref 70–99)
Potassium: 4.3 mEq/L (ref 3.5–5.1)
Sodium: 141 mEq/L (ref 135–145)
Total Bilirubin: 0.7 mg/dL (ref 0.2–1.2)
Total Protein: 7.6 g/dL (ref 6.0–8.3)

## 2022-03-01 LAB — HIV ANTIBODY (ROUTINE TESTING W REFLEX): HIV 1&2 Ab, 4th Generation: NONREACTIVE

## 2022-03-01 LAB — PSA: PSA: 1.5 ng/mL (ref 0.10–4.00)

## 2022-03-01 LAB — HEPATITIS C ANTIBODY: Hepatitis C Ab: NONREACTIVE

## 2022-06-14 ENCOUNTER — Ambulatory Visit (INDEPENDENT_AMBULATORY_CARE_PROVIDER_SITE_OTHER): Payer: 59 | Admitting: Family Medicine

## 2022-06-14 ENCOUNTER — Encounter: Payer: Self-pay | Admitting: Family Medicine

## 2022-06-14 ENCOUNTER — Encounter: Payer: Self-pay | Admitting: Gastroenterology

## 2022-06-14 VITALS — BP 137/85 | HR 67 | Temp 98.2°F | Ht 72.0 in | Wt 174.0 lb

## 2022-06-14 DIAGNOSIS — R42 Dizziness and giddiness: Secondary | ICD-10-CM

## 2022-06-14 DIAGNOSIS — G479 Sleep disorder, unspecified: Secondary | ICD-10-CM

## 2022-06-14 DIAGNOSIS — Z87891 Personal history of nicotine dependence: Secondary | ICD-10-CM

## 2022-06-14 DIAGNOSIS — M25521 Pain in right elbow: Secondary | ICD-10-CM

## 2022-06-14 DIAGNOSIS — G4700 Insomnia, unspecified: Secondary | ICD-10-CM | POA: Diagnosis not present

## 2022-06-14 DIAGNOSIS — Z1211 Encounter for screening for malignant neoplasm of colon: Secondary | ICD-10-CM

## 2022-06-14 DIAGNOSIS — R5383 Other fatigue: Secondary | ICD-10-CM | POA: Diagnosis not present

## 2022-06-14 MED ORDER — TRAZODONE HCL 50 MG PO TABS: 25.0000 mg | ORAL_TABLET | Freq: Every evening | ORAL | 3 refills | Status: AC | PRN

## 2022-06-14 NOTE — Assessment & Plan Note (Signed)
Quit 5 years ago.  Has 20-pack-year history of smoking.  Will refer for lung cancer screening.

## 2022-06-14 NOTE — Assessment & Plan Note (Signed)
Likely multifactorial.  He is having quite a bit of insomnia which is likely the main contributor.  Did have labs done a couple of months ago which was normal.  We will be working on his insomnia as above.  If this continues to be an issue would consider referral for sleep study.  He can try taking B complex vitamins to see if this helps.

## 2022-06-14 NOTE — Progress Notes (Signed)
Joel Evans is a 56 y.o. male who presents today for an office visit. He is transferring care.   Assessment/Plan:  Chronic Problems Addressed Today: Insomnia Previous PCP has prescribed trazodone which has only been modestly effective.  He is normally able to fall asleep okay but has trouble staying asleep throughout the night especially if he has to wake up to urinate.  We discussed additional treatment options.  We can try melatonin nightly.  He will follow-up with me in several weeks and let me know if this is not working.  Discussed light hygiene measures.  They consider trial of gabapentin if still has an issue with this.  Other fatigue Likely multifactorial.  He is having quite a bit of insomnia which is likely the main contributor.  Did have labs done a couple of months ago which was normal.  We will be working on his insomnia as above.  If this continues to be an issue would consider referral for sleep study.  He can try taking B complex vitamins to see if this helps.  Elbow pain, right Decreased range of motion on exam.  May have underlying osteoarthritis.  Will check x-ray today to further evaluate.  May consider referral to PT or sports medicine depending on results.  Vertigo Stable.  No recent recurrences.  Uses meclizine as needed.  Former smoker Quit 5 years ago.  Has 20-pack-year history of smoking.  Will refer for lung cancer screening.     Subjective:  HPI:  See A/p for status of chronic conditions.   He is having some issues with range of motion and pain in his right elbow.  This has been going on for months.  He suddenly noticed that he had decreased flexion and extension in his right elbow.  No obvious injuries or precipitating events.  No specific treatments tried.  Does have pain with certain motions but overall no pain at rest.  No specific treatments tried.   ROS: Per HPI, otherwise a complete review of systems was negative.   PMH:  The following were  reviewed and entered/updated in epic: History reviewed. No pertinent past medical history. Patient Active Problem List   Diagnosis Date Noted   Insomnia 06/14/2022   Vertigo 06/14/2022   Other fatigue 06/14/2022   Elbow pain, right 06/14/2022   Former smoker 06/14/2022   History reviewed. No pertinent surgical history.  Family History  Problem Relation Age of Onset   Hypertension Mother    Hypertension Father     Medications- reviewed and updated Current Outpatient Medications  Medication Sig Dispense Refill   cyclobenzaprine (FLEXERIL) 5 MG tablet Take 1 tablet (5 mg total) by mouth 3 (three) times daily as needed for muscle spasms. 30 tablet 1   traZODone (DESYREL) 50 MG tablet Take 0.5-1 tablets (25-50 mg total) by mouth at bedtime as needed for sleep. 30 tablet 3   No current facility-administered medications for this visit.    Allergies-reviewed and updated No Known Allergies  Social History   Socioeconomic History   Marital status: Married    Spouse name: Not on file   Number of children: 4   Years of education: Not on file   Highest education level: Not on file  Occupational History   Not on file  Tobacco Use   Smoking status: Former    Packs/day: 0.50    Years: 40.00    Total pack years: 20.00    Types: Cigarettes    Quit date: 2018  Years since quitting: 5.8   Smokeless tobacco: Never  Vaping Use   Vaping Use: Never used  Substance and Sexual Activity   Alcohol use: No   Drug use: No   Sexual activity: Not Currently  Other Topics Concern   Not on file  Social History Narrative   Not on file   Social Determinants of Health   Financial Resource Strain: Not on file  Food Insecurity: Not on file  Transportation Needs: Not on file  Physical Activity: Not on file  Stress: Not on file  Social Connections: Not on file          Objective:  Physical Exam: BP 137/85   Pulse 67   Temp 98.2 F (36.8 C) (Temporal)   Ht 6' (1.829 m)   Wt 174  lb (78.9 kg)   SpO2 99%   BMI 23.60 kg/m   Gen: No acute distress, resting comfortably CV: Regular rate and rhythm with no murmurs appreciated Pulm: Normal work of breathing, clear to auscultation bilaterally with no crackles, wheezes, or rhonchi MSK:  - Right Arm: No deformities.  Right elbow extension limited to about 150 degrees and flexion limited to about 60 degrees.  No pain on palpation.  Neurovascular intact distally. Neuro: Grossly normal, moves all extremities Psych: Normal affect and thought content  Time Spent: 40 minutes of total time was spent on the date of the encounter performing the following actions: chart review prior to seeing the patient including visits with previous PCP, obtaining history, performing a medically necessary exam, counseling on the treatment plan, placing orders, and documenting in our EHR.        Algis Greenhouse. Jerline Pain, MD 06/14/2022 8:36 AM

## 2022-06-14 NOTE — Assessment & Plan Note (Signed)
Decreased range of motion on exam.  May have underlying osteoarthritis.  Will check x-ray today to further evaluate.  May consider referral to PT or sports medicine depending on results.

## 2022-06-14 NOTE — Patient Instructions (Signed)
It was very nice to see you today!  We will get an x-ray of your elbow.  I will refer you for colon cancer screening and lung cancer screening.  Please try the melatonin to help with your sleep.  I think if you can get better sleep this will help with your energy levels as well.  He can try taking a B complex vitamin to see if this helps.  We will see you back next summer when you are due for your annual physical.  Please come back to see [place] sooner if needed.  Take care, Dr Jerline Pain  PLEASE NOTE:  If you had any lab tests please let us know if you have not heard back within a few days. You may see your results on mychart before we have a chance to review them but we will give you a call once they are reviewed by [place]. If we ordered any referrals today, please let us know if you have not heard from their office within the next week.   Please try these tips to maintain a healthy lifestyle:  Eat at least 3 REAL meals and 1-2 snacks per day.  Aim for no more than 5 hours between eating.  If you eat breakfast, please do so within one hour of getting up.   Each meal should contain half fruits/vegetables, one quarter protein, and one quarter carbs (no bigger than a computer mouse)  Cut down on sweet beverages. This includes juice, soda, and sweet tea.   Drink at least 1 glass of water with each meal and aim for at least 8 glasses per day  Exercise at least 150 minutes every week.

## 2022-06-14 NOTE — Assessment & Plan Note (Signed)
Stable.  No recent recurrences.  Uses meclizine as needed.

## 2022-06-14 NOTE — Assessment & Plan Note (Signed)
Previous PCP has prescribed trazodone which has only been modestly effective.  He is normally able to fall asleep okay but has trouble staying asleep throughout the night especially if he has to wake up to urinate.  We discussed additional treatment options.  We can try melatonin nightly.  He will follow-up with me in several weeks and let me know if this is not working.  Discussed light hygiene measures.  They consider trial of gabapentin if still has an issue with this.

## 2022-06-25 ENCOUNTER — Encounter: Payer: Self-pay | Admitting: Gastroenterology

## 2022-06-25 ENCOUNTER — Encounter: Payer: Self-pay | Admitting: Family Medicine

## 2022-06-26 NOTE — Telephone Encounter (Signed)
Spoke with patient, patient call GI for new forms  Has a GI upcoming appointment

## 2022-07-02 ENCOUNTER — Ambulatory Visit (AMBULATORY_SURGERY_CENTER): Payer: Self-pay | Admitting: *Deleted

## 2022-07-02 ENCOUNTER — Encounter: Payer: Self-pay | Admitting: Family Medicine

## 2022-07-02 VITALS — Ht 72.0 in | Wt 177.0 lb

## 2022-07-02 DIAGNOSIS — Z1211 Encounter for screening for malignant neoplasm of colon: Secondary | ICD-10-CM

## 2022-07-02 MED ORDER — NA SULFATE-K SULFATE-MG SULF 17.5-3.13-1.6 GM/177ML PO SOLN: 1.0000 | Freq: Once | ORAL | 0 refills | Status: AC

## 2022-07-02 NOTE — Progress Notes (Signed)

## 2022-07-04 ENCOUNTER — Telehealth: Payer: Self-pay | Admitting: Gastroenterology

## 2022-07-04 DIAGNOSIS — Z1211 Encounter for screening for malignant neoplasm of colon: Secondary | ICD-10-CM

## 2022-07-04 MED ORDER — NA SULFATE-K SULFATE-MG SULF 17.5-3.13-1.6 GM/177ML PO SOLN: 1.0000 | Freq: Once | ORAL | 0 refills | Status: AC

## 2022-07-04 NOTE — Telephone Encounter (Signed)
Sent RX for [employer] to [facility] in [place] on [address].   LM on wife's VM.

## 2022-07-04 NOTE — Telephone Encounter (Signed)
Inbound call from patients wife stating that she called the pharmacy to see if patients Suprep was ready to be picked up and the pharmacy stated they did not have a prescription. Patients wife requested it to be resent to  [facility] on [address] in [place]. Please advise.

## 2022-07-24 ENCOUNTER — Encounter: Payer: Self-pay | Admitting: Gastroenterology

## 2022-07-25 ENCOUNTER — Ambulatory Visit (AMBULATORY_SURGERY_CENTER): Payer: 59 | Admitting: Gastroenterology

## 2022-07-25 ENCOUNTER — Encounter: Payer: Self-pay | Admitting: Gastroenterology

## 2022-07-25 VITALS — BP 104/83 | HR 66 | Temp 97.3°F | Resp 14 | Ht 72.0 in | Wt 177.0 lb

## 2022-07-25 DIAGNOSIS — D124 Benign neoplasm of descending colon: Secondary | ICD-10-CM | POA: Diagnosis not present

## 2022-07-25 DIAGNOSIS — Z1211 Encounter for screening for malignant neoplasm of colon: Secondary | ICD-10-CM | POA: Diagnosis not present

## 2022-07-25 DIAGNOSIS — D125 Benign neoplasm of sigmoid colon: Secondary | ICD-10-CM

## 2022-07-25 DIAGNOSIS — D123 Benign neoplasm of transverse colon: Secondary | ICD-10-CM | POA: Diagnosis not present

## 2022-07-25 MED ORDER — SODIUM CHLORIDE 0.9 % IV SOLN: 500.0000 mL | Freq: Once | INTRAVENOUS | Status: DC

## 2022-07-25 NOTE — Op Note (Signed)
[facility] Patient Name: Joel Evans Procedure Date: 07/25/2022 8:38 AM MRN: [id number] Endoscopist: Remo Lipps P. Havery Moros , MD, [id number] Age: 56 Referring MD:  Date of Birth: 1966-05-09 Gender: Male Account #: [id number] Procedure:                Colonoscopy Indications:              Screening for colorectal malignant neoplasm, This                            is the patient's first colonoscopy Medicines:                Monitored Anesthesia Care Procedure:                Pre-Anesthesia Assessment:                           - Prior to the procedure, a History and Physical                            was performed, and patient medications and                            allergies were reviewed. The patient's tolerance of                            previous anesthesia was also reviewed. The risks                            and benefits of the procedure and the sedation                            options and risks were discussed with the patient.                            All questions were answered, and informed consent                            was obtained. Prior Anticoagulants: The patient has                            taken no anticoagulant or antiplatelet agents. ASA                            Grade Assessment: I - A normal, healthy patient.                            After reviewing the risks and benefits, the patient                            was deemed in satisfactory condition to undergo the                            procedure.  After obtaining informed consent, the colonoscope                            was passed under direct vision. Throughout the                            procedure, the patient's blood pressure, pulse, and                            oxygen saturations were monitored continuously. The                            Olympus PCF-H190DL [id number]) Colonoscope was                            introduced through the anus and  advanced to the the                            cecum, identified by appendiceal orifice and                            ileocecal valve. The colonoscopy was performed                            without difficulty. The patient tolerated the                            procedure well. The quality of the bowel                            preparation was good. The ileocecal valve,                            appendiceal orifice, and rectum were photographed. Scope In: 8:49:58 AM Scope Out: 9:12:51 AM Scope Withdrawal Time: 0 hours 19 minutes 44 seconds  Total Procedure Duration: 0 hours 22 minutes 53 seconds  Findings:                 The perianal and digital rectal examinations were                            normal.                           A few small-mouthed diverticula were found in the                            ascending colon.                           A 3 mm polyp was found in the transverse colon. The                            polyp was sessile. The polyp was removed with a  cold snare. Resection and retrieval were complete.                           A 3 mm polyp was found in the descending colon. The                            polyp was sessile. The polyp was removed with a                            cold snare. Resection and retrieval were complete.                           A 2 mm polyp was found in the sigmoid colon. The                            polyp was sessile. The polyp was removed with a                            cold snare. Resection and retrieval were complete.                           There entire colon was spastic.                           Internal hemorrhoids were found during retroflexion.                           The exam was otherwise without abnormality. Complications:            No immediate complications. Estimated blood loss:                            Minimal. Estimated Blood Loss:     Estimated blood loss was  minimal. Impression:               - Diverticulosis in the ascending colon.                           - One 3 mm polyp in the transverse colon, removed                            with a cold snare. Resected and retrieved.                           - One 3 mm polyp in the descending colon, removed                            with a cold snare. Resected and retrieved.                           - One 2 mm polyp in the sigmoid colon, removed with  a cold snare. Resected and retrieved.                           - Colonic spasm.                           - Internal hemorrhoids.                           - The examination was otherwise normal. Recommendation:           - Patient has a contact number available for                            emergencies. The signs and symptoms of potential                            delayed complications were discussed with the                            patient. Return to normal activities tomorrow.                            Written discharge instructions were provided to the                            patient.                           - Resume previous diet.                           - Continue present medications.                           - Await pathology results. Remo Lipps P. Kishan Wachsmuth, MD 07/25/2022 9:17:33 AM This report has been signed electronically.

## 2022-07-25 NOTE — Progress Notes (Signed)
Called to room to assist during endoscopic procedure.  Patient ID and intended procedure confirmed with present staff. Received instructions for my participation in the procedure from the performing physician.  

## 2022-07-25 NOTE — Progress Notes (Signed)
Sedate, gd SR, tolerated procedure well, VSS, report to RN 

## 2022-07-25 NOTE — Progress Notes (Signed)
Pt's states no medical or surgical changes since previsit or office visit. 

## 2022-07-25 NOTE — Patient Instructions (Signed)
Please read handouts provided. Continue present medications. Await pathology results.   YOU HAD AN ENDOSCOPIC PROCEDURE TODAY AT THE [facility]:   Refer to the procedure report that was given to you for any specific questions about what was found during the examination.  If the procedure report does not answer your questions, please call your gastroenterologist to clarify.  If you requested that your care partner not be given the details of your procedure findings, then the procedure report has been included in a sealed envelope for you to review at your convenience later.  YOU SHOULD EXPECT: Some feelings of bloating in the abdomen. Passage of more gas than usual.  Walking can help get rid of the air that was put into your GI tract during the procedure and reduce the bloating. If you had a lower endoscopy (such as a colonoscopy or flexible sigmoidoscopy) you may notice spotting of blood in your stool or on the toilet paper. If you underwent a bowel prep for your procedure, you may not have a normal bowel movement for a few days.  Please Note:  You might notice some irritation and congestion in your nose or some drainage.  This is from the oxygen used during your procedure.  There is no need for concern and it should clear up in a day or so.  SYMPTOMS TO REPORT IMMEDIATELY:  Following lower endoscopy (colonoscopy or flexible sigmoidoscopy):  Excessive amounts of blood in the stool  Significant tenderness or worsening of abdominal pains  Swelling of the abdomen that is new, acute  Fever of 100F or higher  For urgent or emergent issues, a gastroenterologist can be reached at any hour by calling [phone]. Do not use MyChart messaging for urgent concerns.    DIET:  We do recommend a small meal at first, but then you may proceed to your regular diet.  Drink plenty of fluids but you should avoid alcoholic beverages for 24 hours.  ACTIVITY:  You should plan to take it easy for  the rest of today and you should NOT DRIVE or use heavy machinery until tomorrow (because of the sedation medicines used during the test).    FOLLOW UP: Our staff will call the number listed on your records the next business day following your procedure.  We will call around 7:15- 8:00 am to check on you and address any questions or concerns that you may have regarding the information given to you following your procedure. If we do not reach you, we will leave a message.     If any biopsies were taken you will be contacted by phone or by letter within the next 1-3 weeks.  Please call us at [phone] if you have not heard about the biopsies in 3 weeks.    SIGNATURES/CONFIDENTIALITY: You and/or your care partner have signed paperwork which will be entered into your electronic medical record.  These signatures attest to the fact that that the information above on your After Visit Summary has been reviewed and is understood.  Full responsibility of the confidentiality of this discharge information lies with you and/or your care-partner. 

## 2022-07-25 NOTE — Progress Notes (Signed)
[facility] History and Physical   Primary Care Physician:  Joel Barrack, MD   Reason for Procedure:   Colon cancer screening  Plan:    colonoscopy     HPI: Joel Evans is a 56 y.o. male  here for colonoscopy screening - first time exam.  Patient denies any bowel symptoms at this time. No family history of colon cancer known. Otherwise feels well without any cardiopulmonary symptoms.   I have discussed risks / benefits of anesthesia and endoscopic procedure with Joel Evans and they wish to proceed with the exams as outlined today.    Past Medical History:  Diagnosis Date   Arthritis    back,hands    Past Surgical History:  Procedure Laterality Date   no past sugeries     updated 07/02/22    Prior to Admission medications   Medication Sig Start Date End Date Taking? Authorizing Provider  amoxicillin (AMOXIL) 500 MG tablet Take 500 mg by mouth 3 (three) times daily. 07/04/22  Yes [provider]  cyclobenzaprine (FLEXERIL) 5 MG tablet Take 1 tablet (5 mg total) by mouth 3 (three) times daily as needed for muscle spasms. 02/20/22   Joel Coss, NP  ibuprofen (ADVIL) 800 MG tablet Take 800 mg by mouth every 8 (eight) hours as needed. 07/04/22   [provider]  MELATONIN PO Take by mouth as needed. For sleep    [provider]  traZODone (DESYREL) 50 MG tablet Take 0.5-1 tablets (25-50 mg total) by mouth at bedtime as needed for sleep. Patient not taking: Reported on 07/02/2022 06/14/22   Joel Barrack, MD    Current Outpatient Medications  Medication Sig Dispense Refill   amoxicillin (AMOXIL) 500 MG tablet Take 500 mg by mouth 3 (three) times daily.     cyclobenzaprine (FLEXERIL) 5 MG tablet Take 1 tablet (5 mg total) by mouth 3 (three) times daily as needed for muscle spasms. 30 tablet 1   ibuprofen (ADVIL) 800 MG tablet Take 800 mg by mouth every 8 (eight) hours as needed.     MELATONIN PO Take by mouth as needed.  For sleep     traZODone (DESYREL) 50 MG tablet Take 0.5-1 tablets (25-50 mg total) by mouth at bedtime as needed for sleep. (Patient not taking: Reported on 07/02/2022) 30 tablet 3   Current Facility-Administered Medications  Medication Dose Route Frequency Provider Last Rate Last Admin   0.9 %  sodium chloride infusion  500 mL Intravenous Once Joel Evans, Joel Raspberry, MD        Allergies as of 07/25/2022   (No Known Allergies)    Family History  Problem Relation Age of Onset   Hypertension Mother    Hypertension Father    Colon cancer Neg Hx    Colon polyps Neg Hx    Crohn's disease Neg Hx    Esophageal cancer Neg Hx    Rectal cancer Neg Hx    Stomach cancer Neg Hx    Ulcerative colitis Neg Hx     Social History   Socioeconomic History   Marital status: Married    Spouse name: Not on file   Number of children: 4   Years of education: Not on file   Highest education level: Not on file  Occupational History   Not on file  Tobacco Use   Smoking status: Former    Packs/day: 0.50    Years: 40.00    Total pack years: 20.00    Types:  Cigarettes    Quit date: 2018    Years since quitting: 5.9    Passive exposure: Current (wife smoke)   Smokeless tobacco: Never  Vaping Use   Vaping Use: Never used  Substance and Sexual Activity   Alcohol use: No   Drug use: No   Sexual activity: Not Currently  Other Topics Concern   Not on file  Social History Narrative   Not on file   Social Determinants of Health   Financial Resource Strain: Not on file  Food Insecurity: Not on file  Transportation Needs: Not on file  Physical Activity: Not on file  Stress: Not on file  Social Connections: Not on file  Intimate Partner Violence: Not on file    Review of Systems: All other review of systems negative except as mentioned in the HPI.  Physical Exam: Vital signs BP (!) 142/91   Pulse 68   Temp (!) 97.3 F (36.3 C)   Ht 6' (1.829 m)   Wt 177 lb (80.3 kg)   SpO2 99%    BMI 24.01 kg/m   General:   Alert,  Well-developed, pleasant and cooperative in NAD Lungs:  Clear throughout to auscultation.   Heart:  Regular rate and rhythm Abdomen:  Soft, nontender and nondistended.   Neuro/Psych:  Alert and cooperative. Normal mood and affect. A and O x 3  Joel Mango, MD [facility]

## 2022-07-26 ENCOUNTER — Telehealth: Payer: Self-pay

## 2022-07-26 NOTE — Telephone Encounter (Signed)
  Follow up Call-     07/25/2022    8:12 AM  Call back number  Post procedure Call Back phone  # [phone]  Permission to leave phone message Yes     Patient questions:  Do you have a fever, pain , or abdominal swelling? No. Pain Score  0 *  Have you tolerated food without any problems? Yes.    Have you been able to return to your normal activities? Yes.    Do you have any questions about your discharge instructions: Diet   No. Medications  No. Follow up visit  No.  Do you have questions or concerns about your Care? No.  Actions: * If pain score is 4 or above: No action needed, pain <4.

## 2022-08-04 ENCOUNTER — Encounter: Payer: Self-pay | Admitting: Family Medicine

## 2022-08-07 NOTE — Telephone Encounter (Signed)
Call patient at [phone], Spoke with patient, patient notified order for Xray was placed in  Patient stated will go and do Xray soon  Xray lab information send to patient via Mychart

## 2022-08-27 ENCOUNTER — Encounter: Payer: Self-pay | Admitting: *Deleted

## 2023-10-08 ENCOUNTER — Encounter: Payer: Self-pay | Admitting: Family Medicine

## 2023-10-08 ENCOUNTER — Ambulatory Visit (INDEPENDENT_AMBULATORY_CARE_PROVIDER_SITE_OTHER): Payer: Self-pay | Admitting: Family Medicine

## 2023-10-08 VITALS — BP 130/78 | HR 70 | Temp 97.0°F | Ht 72.0 in | Wt 179.0 lb

## 2023-10-08 DIAGNOSIS — K4090 Unilateral inguinal hernia, without obstruction or gangrene, not specified as recurrent: Secondary | ICD-10-CM

## 2023-10-08 DIAGNOSIS — Z13220 Encounter for screening for lipoid disorders: Secondary | ICD-10-CM | POA: Diagnosis not present

## 2023-10-08 DIAGNOSIS — Z122 Encounter for screening for malignant neoplasm of respiratory organs: Secondary | ICD-10-CM

## 2023-10-08 DIAGNOSIS — M25521 Pain in right elbow: Secondary | ICD-10-CM

## 2023-10-08 DIAGNOSIS — Z0001 Encounter for general adult medical examination with abnormal findings: Secondary | ICD-10-CM | POA: Diagnosis not present

## 2023-10-08 DIAGNOSIS — Z125 Encounter for screening for malignant neoplasm of prostate: Secondary | ICD-10-CM | POA: Diagnosis not present

## 2023-10-08 DIAGNOSIS — M1990 Unspecified osteoarthritis, unspecified site: Secondary | ICD-10-CM

## 2023-10-08 DIAGNOSIS — M25621 Stiffness of right elbow, not elsewhere classified: Secondary | ICD-10-CM

## 2023-10-08 DIAGNOSIS — R42 Dizziness and giddiness: Secondary | ICD-10-CM

## 2023-10-08 DIAGNOSIS — R5383 Other fatigue: Secondary | ICD-10-CM

## 2023-10-08 LAB — COMPREHENSIVE METABOLIC PANEL
ALT: 12 U/L (ref 0–53)
AST: 17 U/L (ref 0–37)
Albumin: 4.5 g/dL (ref 3.5–5.2)
Alkaline Phosphatase: 68 U/L (ref 39–117)
BUN: 11 mg/dL (ref 6–23)
CO2: 27 meq/L (ref 19–32)
Calcium: 9.3 mg/dL (ref 8.4–10.5)
Chloride: 106 meq/L (ref 96–112)
Creatinine, Ser: 0.83 mg/dL (ref 0.40–1.50)
GFR: 95.97 mL/min (ref >60.00)
Glucose, Bld: 89 mg/dL (ref 70–99)
Potassium: 4.4 meq/L (ref 3.5–5.1)
Sodium: 139 meq/L (ref 135–145)
Total Bilirubin: 0.6 mg/dL (ref 0.2–1.2)
Total Protein: 7.3 g/dL (ref 6.0–8.3)

## 2023-10-08 LAB — CBC
HCT: 42.9 % (ref 39.0–52.0)
Hemoglobin: 14.1 g/dL (ref 13.0–17.0)
MCHC: 32.9 g/dL (ref 30.0–36.0)
MCV: 94.2 fl (ref 78.0–100.0)
Platelets: 244 10*3/uL (ref 150.0–400.0)
RBC: 4.55 Mil/uL (ref 4.22–5.81)
RDW: 13.7 % (ref 11.5–15.5)
WBC: 5.6 10*3/uL (ref 4.0–10.5)

## 2023-10-08 LAB — LIPID PANEL
Cholesterol: 196 mg/dL (ref 0–200)
HDL: 50.8 mg/dL (ref >39.00)
LDL Cholesterol: 130 mg/dL — ABNORMAL HIGH (ref 0–99)
NonHDL: 145.47
Total CHOL/HDL Ratio: 4
Triglycerides: 75 mg/dL (ref 0.0–149.0)
VLDL: 15 mg/dL (ref 0.0–40.0)

## 2023-10-08 LAB — PSA: PSA: 1.82 ng/mL (ref 0.10–4.00)

## 2023-10-08 LAB — VITAMIN B12: Vitamin B-12: 151 pg/mL — ABNORMAL LOW (ref 211–911)

## 2023-10-08 LAB — TSH: TSH: 0.6 u[IU]/mL (ref 0.35–5.50)

## 2023-10-08 LAB — HEMOGLOBIN A1C: Hgb A1c MFr Bld: 5.1 % (ref 4.6–6.5)

## 2023-10-08 LAB — VITAMIN D 25 HYDROXY (VIT D DEFICIENCY, FRACTURES): VITD: 12.56 ng/mL — ABNORMAL LOW (ref 30.00–100.00)

## 2023-10-08 MED ORDER — IBUPROFEN 800 MG PO TABS: 800.0000 mg | ORAL_TABLET | Freq: Three times a day (TID) | ORAL | 5 refills | Status: DC | PRN

## 2023-10-08 MED ORDER — CYCLOBENZAPRINE HCL 5 MG PO TABS: 5.0000 mg | ORAL_TABLET | Freq: Three times a day (TID) | ORAL | 1 refills | Status: DC | PRN

## 2023-10-08 MED ORDER — KETOCONAZOLE 2 % EX CREA: 1.0000 | TOPICAL_CREAM | Freq: Two times a day (BID) | CUTANEOUS | 0 refills | Status: DC

## 2023-10-08 NOTE — Patient Instructions (Signed)
 It was very nice to see you today!  We will check blood work today.  Please try the ketoconazole for the rash on your feet.  I will refer you to see the sports medicine group for your elbow.  I will refill your medications today.  Please continue to work on diet and exercise.  Return in about 1 year (around 10/07/2024) for Annual Physical.   Take care, Dr Jimmey Ralph  PLEASE NOTE:  If you had any lab tests, please let us know if you have not heard back within a few days. You may see your results on mychart before we have a chance to review them but we will give you a call once they are reviewed by [place].   If we ordered any referrals today, please let us know if you have not heard from their office within the next week.   If you had any urgent prescriptions sent in today, please check with the pharmacy within an hour of our visit to make sure the prescription was transmitted appropriately.   Please try these tips to maintain a healthy lifestyle:  Eat at least 3 REAL meals and 1-2 snacks per day.  Aim for no more than 5 hours between eating.  If you eat breakfast, please do so within one hour of getting up.   Each meal should contain half fruits/vegetables, one quarter protein, and one quarter carbs (no bigger than a computer mouse)  Cut down on sweet beverages. This includes juice, soda, and sweet tea.   Drink at least 1 glass of water with each meal and aim for at least 8 glasses per day  Exercise at least 150 minutes every week.    Preventive Care 60-60 Years Old, Male Preventive care refers to lifestyle choices and visits with your health care provider that can promote health and wellness. Preventive care visits are also called wellness exams. What can I expect for my preventive care visit? Counseling During your preventive care visit, your health care provider may ask about your: Medical history, including: Past medical problems. Family medical history. Current health,  including: Emotional well-being. Home life and relationship well-being. Sexual activity. Lifestyle, including: Alcohol, nicotine or tobacco, and drug use. Access to firearms. Diet, exercise, and sleep habits. Safety issues such as seatbelt and bike helmet use. Sunscreen use. Work and work Astronomer. Physical exam Your health care provider will check your: Height and weight. These may be used to calculate your BMI (body mass index). BMI is a measurement that tells if you are at a healthy weight. Waist circumference. This measures the distance around your waistline. This measurement also tells if you are at a healthy weight and may help predict your risk of certain diseases, such as type 2 diabetes and high blood pressure. Heart rate and blood pressure. Body temperature. Skin for abnormal spots. What immunizations do I need?  Vaccines are usually given at various ages, according to a schedule. Your health care provider will recommend vaccines for you based on your age, medical history, and lifestyle or other factors, such as travel or where you work. What tests do I need? Screening Your health care provider may recommend screening tests for certain conditions. This may include: Lipid and cholesterol levels. Diabetes screening. This is done by checking your blood sugar (glucose) after you have not eaten for a while (fasting). Hepatitis B test. Hepatitis C test. HIV (human immunodeficiency virus) test. STI (sexually transmitted infection) testing, if you are at risk. Lung cancer screening. Prostate  cancer screening. Colorectal cancer screening. Talk with your health care provider about your test results, treatment options, and if necessary, the need for more tests. Follow these instructions at home: Eating and drinking  Eat a diet that includes fresh fruits and vegetables, whole grains, lean protein, and low-fat dairy products. Take vitamin and mineral supplements as recommended  by your health care provider. Do not drink alcohol if your health care provider tells you not to drink. If you drink alcohol: Limit how much you have to 0-2 drinks a day. Know how much alcohol is in your drink. In the [place] one drink equals one 12 oz bottle of beer (355 mL), one 5 oz glass of wine (148 mL), or one 1 oz glass of hard liquor (44 mL). Lifestyle Brush your teeth every morning and night with fluoride toothpaste. Floss one time each day. Exercise for at least 30 minutes 5 or more days each week. Do not use any products that contain nicotine or tobacco. These products include cigarettes, chewing tobacco, and vaping devices, such as e-cigarettes. If you need help quitting, ask your health care provider. Do not use drugs. If you are sexually active, practice safe sex. Use a condom or other form of protection to prevent STIs. Take aspirin only as told by your health care provider. Make sure that you understand how much to take and what form to take. Work with your health care provider to find out whether it is safe and beneficial for you to take aspirin daily. Find healthy ways to manage stress, such as: Meditation, yoga, or listening to music. Journaling. Talking to a trusted person. Spending time with friends and family. Minimize exposure to UV radiation to reduce your risk of skin cancer. Safety Always wear your seat belt while driving or riding in a vehicle. Do not drive: If you have been drinking alcohol. Do not ride with someone who has been drinking. When you are tired or distracted. While texting. If you have been using any mind-altering substances or drugs. Wear a helmet and other protective equipment during sports activities. If you have firearms in your house, make sure you follow all gun safety procedures. What's next? Go to your health care provider once a year for an annual wellness visit. Ask your health care provider how often you should have your eyes and teeth  checked. Stay up to date on all vaccines. This information is not intended to replace advice given to you by your health care provider. Make sure you discuss any questions you have with your health care provider. Document Revised: 01/18/2021 Document Reviewed: 01/18/2021 Elsevier Patient Education  2024 [employer].

## 2023-10-08 NOTE — Assessment & Plan Note (Signed)
 Overall stable.  Uses meclizine as needed.

## 2023-10-08 NOTE — Assessment & Plan Note (Signed)
 Patient with pain in bilateral hands likely secondary to osteoarthritis.  Uses ibuprofen as needed.  Will refill today.  Also recommended over-the-counter Voltaren gel as well as glucosamine/chondroitin supplementation.

## 2023-10-08 NOTE — Assessment & Plan Note (Signed)
 Still having significant issues with pain in his right elbow.  This has been going on for over a year.  Will refer to sports medicine for further evaluation and management.

## 2023-10-08 NOTE — Progress Notes (Signed)
 Chief Complaint:  Joel Evans is a 58 y.o. male who presents today for his annual comprehensive physical exam.    Assessment/Plan:  New/Acute Problems: Tinea pedis No red flags.  Start topical ketoconazole.  Discussed importance of keeping area dry.  Chronic Problems Addressed Today: Elbow pain, right Still having significant issues with pain in his right elbow.  This has been going on for over a year.  Will refer to sports medicine for further evaluation and management.  Vertigo Overall stable.  Uses meclizine as needed.  Osteoarthritis Patient with pain in bilateral hands likely secondary to osteoarthritis.  Uses ibuprofen as needed.  Will refill today.  Also recommended over-the-counter Voltaren gel as well as glucosamine/chondroitin supplementation.  Preventative Healthcare: Check labs.  Will refer for lung cancer screening.  Up-to-date on colonoscopy.  Declined shingles and flu vaccine.  Patient Counseling(The following topics were reviewed and/or handout was given):  -Nutrition: Stressed importance of moderation in sodium/caffeine intake, saturated fat and cholesterol, caloric balance, sufficient intake of fresh fruits, vegetables, and fiber.  -Stressed the importance of regular exercise.   -Substance Abuse: Discussed cessation/primary prevention of tobacco, alcohol, or other drug use; driving or other dangerous activities under the influence; availability of treatment for abuse.   -Injury prevention: Discussed safety belts, safety helmets, smoke detector, smoking near bedding or upholstery.   -Sexuality: Discussed sexually transmitted diseases, partner selection, use of condoms, avoidance of unintended pregnancy and contraceptive alternatives.   -Dental health: Discussed importance of regular tooth brushing, flossing, and dental visits.  -Health maintenance and immunizations reviewed. Please refer to Health maintenance section.  Return to care in 1 year for next  preventative visit.     Subjective:  HPI:  He has no acute complaints today. See Assessment / plan of status of chronic conditions.   He has had a rash in his toes for the last few months. Has treated this anti fungals in the past which worked well to symptoms come back.  Very itchy.  Lifestyle Diet: Balanced.  Plenty of fruits and vegetables. Exercise: Very active with work though has no specific exercise regimen.     10/08/2023    9:58 AM  Depression screen PHQ 2/9  Decreased Interest 0  Down, Depressed, Hopeless 0  PHQ - 2 Score 0    Health Maintenance Due  Topic Date Due   Lung Cancer Screening  Never done     ROS: Per HPI, otherwise a complete review of systems was negative.   PMH:  The following were reviewed and entered/updated in epic: Past Medical History:  Diagnosis Date   Arthritis    back,hands   Patient Active Problem List   Diagnosis Date Noted   Osteoarthritis 10/08/2023   Insomnia 06/14/2022   Vertigo 06/14/2022   Other fatigue 06/14/2022   Elbow pain, right 06/14/2022   Former smoker 06/14/2022   Past Surgical History:  Procedure Laterality Date   no past sugeries     updated 07/02/22    Family History  Problem Relation Age of Onset   Hypertension Mother    Hypertension Father    Colon cancer Neg Hx    Colon polyps Neg Hx    Crohn's disease Neg Hx    Esophageal cancer Neg Hx    Rectal cancer Neg Hx    Stomach cancer Neg Hx    Ulcerative colitis Neg Hx     Medications- reviewed and updated Current Outpatient Medications  Medication Sig Dispense Refill   ketoconazole (NIZORAL)  2 % cream Apply 1 Application topically 2 (two) times daily. 60 g 0   MELATONIN PO Take by mouth as needed. For sleep     traZODone (DESYREL) 50 MG tablet Take 0.5-1 tablets (25-50 mg total) by mouth at bedtime as needed for sleep. 30 tablet 3   cyclobenzaprine (FLEXERIL) 5 MG tablet Take 1 tablet (5 mg total) by mouth 3 (three) times daily as needed for  muscle spasms. 30 tablet 1   ibuprofen (ADVIL) 800 MG tablet Take 1 tablet (800 mg total) by mouth every 8 (eight) hours as needed. 30 tablet 5   No current facility-administered medications for this visit.    Allergies-reviewed and updated No Known Allergies  Social History   Socioeconomic History   Marital status: Married    Spouse name: Not on file   Number of children: 4   Years of education: Not on file   Highest education level: Not on file  Occupational History   Not on file  Tobacco Use   Smoking status: Former    Current packs/day: 0.00    Average packs/day: 0.5 packs/day for 40.0 years (20.0 ttl pk-yrs)    Types: Cigarettes    Start date: 50    Quit date: 2018    Years since quitting: 7.1    Passive exposure: Current (wife smoke)   Smokeless tobacco: Never  Vaping Use   Vaping status: Never Used  Substance and Sexual Activity   Alcohol use: No   Drug use: No   Sexual activity: Not Currently  Other Topics Concern   Not on file  Social History Narrative   Not on file   Social Drivers of Health   Financial Resource Strain: Not on file  Food Insecurity: Not on file  Transportation Needs: Not on file  Physical Activity: Not on file  Stress: Not on file  Social Connections: Unknown (12/19/2021)   [facility]   Social Network    Social Network: Not on file        Objective:  Physical Exam: BP 130/78   Pulse 70   Temp (!) 97 F (36.1 C) (Temporal)   Ht 6' (1.829 m)   Wt 179 lb (81.2 kg)   SpO2 95%   BMI 24.28 kg/m   Body mass index is 24.28 kg/m. Wt Readings from Last 3 Encounters:  10/08/23 179 lb (81.2 kg)  07/25/22 177 lb (80.3 kg)  07/02/22 177 lb (80.3 kg)   Gen: NAD, resting comfortably HEENT: TMs normal bilaterally. OP clear. No thyromegaly noted.  CV: RRR with no murmurs appreciated Pulm: NWOB, CTAB with no crackles, wheezes, or rhonchi GI: Normal bowel sounds present. Soft, Nontender,  Nondistended. MSK: no edema, cyanosis, or clubbing noted Skin: warm, dry.  Macerated erythematous rash on the interdigital areas of feet bilaterally consistent with tinea pedis. Neuro: CN2-12 grossly intact. Strength 5/5 in upper and lower extremities. Reflexes symmetric and intact bilaterally.  Psych: Normal affect and thought content     Joel Evans M. Jimmey Ralph, MD 10/08/2023 10:39 AM

## 2023-10-09 ENCOUNTER — Encounter: Payer: Self-pay | Admitting: Family Medicine

## 2023-10-09 DIAGNOSIS — E785 Hyperlipidemia, unspecified: Secondary | ICD-10-CM | POA: Insufficient documentation

## 2023-10-09 NOTE — Progress Notes (Signed)
 His bad cholesterol is up a little bit.  Do not need to start meds for this however he should continue to work on diet and exercise and we can recheck again in a year.   His B12 and vitamin D are both low.  This is probably causing some of his symptoms.  Recommend he start 1000 mcg B12 by mouth daily.  Also recommend he start vitamin D supplementation 50,000 IUs once weekly.  Please send a new prescription for this.  I would like to see him back in 3 months to recheck these.  The rest of his labs are normal and we can recheck everything else in a year.

## 2023-10-10 ENCOUNTER — Telehealth: Payer: Self-pay

## 2023-10-10 ENCOUNTER — Other Ambulatory Visit: Payer: Self-pay

## 2023-10-10 DIAGNOSIS — M25621 Stiffness of right elbow, not elsewhere classified: Secondary | ICD-10-CM

## 2023-10-10 DIAGNOSIS — K4090 Unilateral inguinal hernia, without obstruction or gangrene, not specified as recurrent: Secondary | ICD-10-CM

## 2023-10-10 MED ORDER — KETOCONAZOLE 2 % EX CREA: 1.0000 | TOPICAL_CREAM | Freq: Two times a day (BID) | CUTANEOUS | 0 refills | Status: AC

## 2023-10-10 MED ORDER — CYCLOBENZAPRINE HCL 5 MG PO TABS: 5.0000 mg | ORAL_TABLET | Freq: Three times a day (TID) | ORAL | 1 refills | Status: DC | PRN

## 2023-10-10 MED ORDER — IBUPROFEN 800 MG PO TABS: 800.0000 mg | ORAL_TABLET | Freq: Three times a day (TID) | ORAL | 5 refills | Status: DC | PRN

## 2023-10-10 NOTE — Telephone Encounter (Signed)
 Copied from CRM [id number]. Topic: Clinical - Prescription Issue >> Oct 09, 2023  4:02 PM Sim Boast F wrote: Reason for CRM: The Cyclobenzaprine, Ibuprofen, Ketoconazole medications were sent to wrong pharmacy yesterday and has to be resent to [facility] - [place] [place] - [address] [place] [address] Phone: [phone] Fax: [phone]    Sent these prescriptions to the requested pharmacy. Sent my chart message informing patient. Donzetta Starch, CMA

## 2023-10-16 ENCOUNTER — Other Ambulatory Visit: Payer: Self-pay

## 2023-10-16 DIAGNOSIS — E559 Vitamin D deficiency, unspecified: Secondary | ICD-10-CM

## 2023-10-16 MED ORDER — VITAMIN D (ERGOCALCIFEROL) 1.25 MG (50000 UNIT) PO CAPS: 50000.0000 [IU] | ORAL_CAPSULE | ORAL | 0 refills | Status: DC

## 2023-10-16 NOTE — Progress Notes (Deleted)
   Joel Payor, PhD, LAT, ATC acting as a scribe for [name], MD.  Joel Evans is a 58 y.o. male who presents to [facility] today for R elbow pain ongoing for over a year. Pt locates pain to ***  Radiates: Paresthesia: Grip strength: Aggravates: Treatments tried:  Pertinent review of systems: ***  Relevant historical information: ***   Exam:  There were no vitals taken for this visit. General: Well Developed, well nourished, and in no acute distress.   MSK: ***    Lab and Radiology Results No results found for this or any previous visit (from the past 72 hours). No results found.     Assessment and Plan: 58 y.o. male with ***   PDMP not reviewed this encounter. No orders of the defined types were placed in this encounter.  No orders of the defined types were placed in this encounter.    Discussed warning signs or symptoms. Please see discharge instructions. Patient expresses understanding.   ***

## 2023-10-17 ENCOUNTER — Ambulatory Visit: Admitting: Family Medicine

## 2023-11-05 ENCOUNTER — Encounter: Payer: 59 | Admitting: Family Medicine

## 2023-11-18 ENCOUNTER — Telehealth: Payer: Self-pay | Admitting: *Deleted

## 2023-11-18 ENCOUNTER — Other Ambulatory Visit: Payer: Self-pay | Admitting: *Deleted

## 2023-11-18 DIAGNOSIS — E559 Vitamin D deficiency, unspecified: Secondary | ICD-10-CM

## 2023-11-18 MED ORDER — VITAMIN D (ERGOCALCIFEROL) 1.25 MG (50000 UNIT) PO CAPS: 50000.0000 [IU] | ORAL_CAPSULE | ORAL | 0 refills | Status: AC

## 2023-11-18 NOTE — Telephone Encounter (Signed)
 Copied from CRM [id number]. Topic: Clinical - Medication Question >> Nov 15, 2023 11:10 AM Dimple Francis wrote: Reason for CRM: Patient wanting to know if Vitamin D, Ergocalciferol, (DRISDOL) 1.25 MG (50000 UNIT) CAPS capsule needs to get another prescription, he is out of the pills   Rx Vit D send to pharmacy  Future lab order  Patient notified, will call after finish Vit D, to schedule a lab appt  To repeat Vit D levels  Ahnesty Finfrock,RMA

## 2024-03-11 ENCOUNTER — Ambulatory Visit: Payer: Self-pay

## 2024-03-11 NOTE — Telephone Encounter (Signed)
 FYI Only or Action Required?: FYI only for provider.  Patient was last seen in primary care on 10/08/2023 by Kennyth Worth HERO, MD.  Called Nurse Triage reporting Back Pain.  Symptoms began several months ago.  Interventions attempted: OTC medications: Aleve.  Symptoms are: gradually worsening.  Triage Disposition: See PCP When Office is Open (Within 3 Days)  Patient/caregiver understands and will follow disposition?: Yes, but will wait- No appts avail, pt prefers to see someone at office vs UC. Appt scheduled for 8/11 at 0800 Copied from CRM #[id number]. Topic: Clinical - Red Word Triage >> Mar 11, 2024  3:46 PM Harlene ORN wrote: Red Word that prompted transfer to Nurse Triage: issues with his back and left leg and right knee. Pain has been going on for a while. Been getting worse in the past two weeks. Reason for Disposition  [1] Pain radiates into the thigh or further down the leg AND [2] one leg  Answer Assessment - Initial Assessment Questions 1. ONSET: When did the pain begin? (e.g., minutes, hours, days)     Getting worse of 4-5 weeks  2. LOCATION: Where does it hurt? (upper, mid or lower back)     Lower back  3. SEVERITY: How bad is the pain?  (e.g., Scale 1-10; mild, moderate, or severe)     8/10 pain  4. PATTERN: Is the pain constant? (e.g., yes, no; constant, intermittent)      Constant  5. RADIATION: Does the pain shoot into your legs or somewhere else?     Shooting down left leg x 6 months  6. CAUSE:  What do you think is causing the back pain?      Unsure of cause but thought it may be arthritis  7. BACK OVERUSE:  Any recent lifting of heavy objects, strenuous work or exercise?     Yes, does a lot of lifting and bending  8. MEDICINES: What have you taken so far for the pain? (e.g., nothing, acetaminophen , NSAIDS)     Aleve  9. NEUROLOGIC SYMPTOMS: Do you have any weakness, numbness, or problems with bowel/bladder control?     No  10. OTHER  SYMPTOMS: Do you have any other symptoms? (e.g., fever, abdomen pain, burning with urination, blood in urine)       Feels like Right knee is popping out of place  Protocols used: Back Pain-A-AH

## 2024-03-12 NOTE — Telephone Encounter (Signed)
 Patient has an appt on 03/16/2024

## 2024-03-16 ENCOUNTER — Encounter: Payer: Self-pay | Admitting: Family Medicine

## 2024-03-16 ENCOUNTER — Ambulatory Visit: Admitting: Family Medicine

## 2024-03-16 VITALS — BP 137/87 | HR 58 | Temp 98.1°F | Ht 72.0 in | Wt 174.0 lb

## 2024-03-16 DIAGNOSIS — K4090 Unilateral inguinal hernia, without obstruction or gangrene, not specified as recurrent: Secondary | ICD-10-CM

## 2024-03-16 DIAGNOSIS — M545 Low back pain, unspecified: Secondary | ICD-10-CM

## 2024-03-16 DIAGNOSIS — M25561 Pain in right knee: Secondary | ICD-10-CM | POA: Diagnosis not present

## 2024-03-16 DIAGNOSIS — G8929 Other chronic pain: Secondary | ICD-10-CM

## 2024-03-16 DIAGNOSIS — M5432 Sciatica, left side: Secondary | ICD-10-CM | POA: Diagnosis not present

## 2024-03-16 DIAGNOSIS — M25562 Pain in left knee: Secondary | ICD-10-CM

## 2024-03-16 MED ORDER — CYCLOBENZAPRINE HCL 5 MG PO TABS: 5.0000 mg | ORAL_TABLET | Freq: Three times a day (TID) | ORAL | 1 refills | Status: AC | PRN

## 2024-03-16 MED ORDER — PREDNISONE 10 MG PO TABS: ORAL_TABLET | ORAL | 0 refills | Status: AC

## 2024-03-16 NOTE — Progress Notes (Signed)
 Subjective  CC:  Chief Complaint  Patient presents with   Back Pain    Pt stated that he has been experiencing some lower back pain for the past couple of months and left leg pain. He stated that there is also something going on with the right leg with something will jump in/out pf place     HPI: Joel Evans is a 58 y.o. male who presents to the office today to address the problems listed above in the chief complaint. Discussed the use of AI scribe software for clinical note transcription with the patient, who gave verbal consent to proceed.  Same day acute visit; PCP not available. New pt to me. Chart reviewed.   History of Present Illness Joel Evans is a 58 year old male with a history of sciatica and arthritis in the lower back who presents with severe lower back pain radiating to the leg.  He has been experiencing severe lower back pain for a couple of months, described as soreness similar to being in a car wreck, affecting his whole side. The pain is primarily located in the left buttocks and radiates down the left leg, persisting daily. 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  He also reports pain in his upper back that started about a month ago, initially localized to the lower back but has since moved upwards. He has not taken any medication for the pain, preferring to manage it with ice and heat. The pain affects his sleep, causing difficulty sleeping well at night.  Additionally, he mentions that his right leg sometimes 'pops out of joint' with certain movements, which he can manually adjust back into place. He reports that his knee was swollen yesterday.  He has a history of arthritis in his lower back and recalls a period last year when he was unable to walk due to back pain, which was alleviated by chiropractic treatment. He has not returned to the chiropractor  since then. He rates his pain as variable, but not as severe as it was a year ago, although it is progressively worsening.  No weakness in the legs. Reports difficulty sleeping due to pain. No medication use for pain management.   Assessment  1. Left sided sciatica   2. Right inguinal hernia   3. Chronic pain of both knees   4. Chronic right-sided low back pain without sciatica      Plan  Assessment and Plan Assessment & Plan Low back pain with left-sided sciatica Chronic low back pain with left-sided sciatica, affecting sleep. Previous chiropractic adjustments provided relief. Possible underlying arthritis. - Prescribed prednisone  for nerve inflammation. - Prescribed muscle relaxer at night for muscle pain. - Recommended chiropractic adjustment. - Follow up with Doctor Kennyth in 2-3 weeks.  Left knee pain with possible instability Intermittent left knee pain with instability and swelling. Possible ligament strain or meniscus/cartilage injury. - Follow up with Doctor Kennyth in 2-3 weeks for further evaluation.  Upper back pain Upper back pain for about a month, initially starting in the lower back.    Follow up: 2-3 weeks for recheck No orders of the defined types were placed in this encounter.  Meds ordered this encounter  Medications   cyclobenzaprine  (FLEXERIL ) 5 MG tablet    Sig: Take 1 tablet (5 mg total) by mouth 3 (three) times daily as needed for muscle spasms.  Dispense:  30 tablet    Refill:  1   predniSONE  (DELTASONE ) 10 MG tablet    Sig: Take 4 tabs qd x 2 days, 3 qd x 2 days, 2 qd x 2d, 1qd x 3 days    Dispense:  21 tablet    Refill:  0     I reviewed the patients updated PMH, FH, and SocHx.  Patient Active Problem List   Diagnosis Date Noted   Dyslipidemia 10/09/2023   Osteoarthritis 10/08/2023   Insomnia 06/14/2022   Vertigo 06/14/2022   Other fatigue 06/14/2022   Elbow pain, right 06/14/2022   Former smoker 06/14/2022   Current Meds   Medication Sig   predniSONE  (DELTASONE ) 10 MG tablet Take 4 tabs qd x 2 days, 3 qd x 2 days, 2 qd x 2d, 1qd x 3 days   Allergies: Patient has no known allergies. Family History: Patient family history includes Hypertension in his father and mother. Social History:  Patient  reports that he quit smoking about 7 years ago. His smoking use included cigarettes. He started smoking about 47 years ago. He has a 20 pack-year smoking history. He has been exposed to tobacco smoke. He has never used smokeless tobacco. He reports that he does not drink alcohol and does not use drugs.  Review of Systems: Constitutional: Negative for fever malaise or anorexia Cardiovascular: negative for chest pain Respiratory: negative for SOB or persistent cough Gastrointestinal: negative for abdominal pain  Objective  Vitals: BP 137/87   Pulse (!) 58   Temp 98.1 F (36.7 C)   Ht 6' (1.829 m)   Wt 174 lb (78.9 kg)   SpO2 98%   BMI 23.60 kg/m  General: no acute distress , A&Ox3, nl gait Back: FROM pain with extension and forward and lateral flexion on left, neg SLR B, nl DTRs, nl strength, + left paravertebral mm ttp Bilateral knee nl appearing w/ crepitus [place] side effects, risks, benefits, and alternatives for medications and treatment plan prescribed today were discussed, and the patient expressed understanding of the given instructions. Patient is instructed to call or message via MyChart if he/she has any questions or concerns regarding our treatment plan. No barriers to understanding were identified. We discussed Red Flag symptoms and signs in detail. Patient expressed understanding regarding what to do in case of urgent or emergency type symptoms.  Medication list was reconciled, printed and provided to the patient in AVS. Patient instructions and summary information was reviewed with the patient as documented in the AVS. This note was prepared with assistance of Dragon voice recognition software.  Occasional wrong-word or sound-a-like substitutions may have occurred due to the inherent limitations of voice recognition software

## 2024-03-16 NOTE — Patient Instructions (Signed)
 Please return in 2-3 weeks with Dr. Kennyth for recheck  If you have any questions or concerns, please don't hesitate to send me a message via MyChart or call the office at [phone]. Thank you for visiting with us  today! It's our pleasure caring for you.    VISIT SUMMARY: During your visit, we discussed your severe lower back pain radiating to your leg, upper back pain, and intermittent left knee pain with instability. We reviewed your history of sciatica and arthritis in the lower back, and you shared that the pain has been affecting your sleep and daily activities.  YOUR PLAN: -LOW BACK PAIN WITH LEFT-SIDED SCIATICA: Sciatica is pain that radiates along the path of the sciatic nerve, which runs down one or both legs from the lower back. We prescribed prednisone  to reduce nerve inflammation and a muscle relaxer to take at night for muscle pain. We also recommend you see a chiropractor for adjustments. Please follow up with Doctor Kennyth in 2-3 weeks.  -LEFT KNEE PAIN WITH POSSIBLE INSTABILITY: Your left knee pain and instability might be due to a ligament strain or a meniscus/cartilage injury. We will evaluate this further during your follow-up with Doctor Kennyth in 2-3 weeks.  -UPPER BACK PAIN: Your upper back pain started about a month ago and initially began in your lower back. We will monitor this condition and address it further during your follow-up visit.  INSTRUCTIONS: Please follow up with Doctor Kennyth in 2-3 weeks for further evaluation of your conditions. Make sure to take the prescribed medications as directed and consider visiting a chiropractor for adjustments.                      Contains text generated by [employer].                                 Contains text generated by [employer].

## 2024-10-08 ENCOUNTER — Encounter: Admitting: Family Medicine
